# Patient Record
Sex: Female | Born: 1945 | Race: Black or African American | Hispanic: No | Marital: Married | Smoking: Never smoker
Health system: Southern US, Community
[De-identification: ages and names within clinical notes are randomized; demographics above are authoritative.]

## PROBLEM LIST (undated history)

## (undated) DIAGNOSIS — K219 Gastro-esophageal reflux disease without esophagitis: Secondary | ICD-10-CM

## (undated) DIAGNOSIS — Z800 Family history of malignant neoplasm of digestive organs: Secondary | ICD-10-CM

## (undated) DIAGNOSIS — E785 Hyperlipidemia, unspecified: Secondary | ICD-10-CM

## (undated) DIAGNOSIS — H93A9 Pulsatile tinnitus, unspecified ear: Secondary | ICD-10-CM

## (undated) DIAGNOSIS — I10 Essential (primary) hypertension: Secondary | ICD-10-CM

## (undated) DIAGNOSIS — Z803 Family history of malignant neoplasm of breast: Secondary | ICD-10-CM

## (undated) DIAGNOSIS — M1990 Unspecified osteoarthritis, unspecified site: Secondary | ICD-10-CM

## (undated) DIAGNOSIS — R0789 Other chest pain: Secondary | ICD-10-CM

## (undated) DIAGNOSIS — D704 Cyclic neutropenia: Secondary | ICD-10-CM

## (undated) DIAGNOSIS — C50919 Malignant neoplasm of unspecified site of unspecified female breast: Principal | ICD-10-CM

## (undated) DIAGNOSIS — E7800 Pure hypercholesterolemia, unspecified: Secondary | ICD-10-CM

## (undated) HISTORY — DX: Pulsatile tinnitus, unspecified ear: H93.A9

## (undated) HISTORY — DX: Hyperlipidemia, unspecified: E78.5

## (undated) HISTORY — DX: Cyclic neutropenia: D70.4

## (undated) HISTORY — PX: BREAST EXCISIONAL BIOPSY: SUR124

## (undated) HISTORY — DX: Family history of malignant neoplasm of breast: Z80.3

## (undated) HISTORY — DX: Other chest pain: R07.89

## (undated) HISTORY — DX: Family history of malignant neoplasm of digestive organs: Z80.0

## (undated) HISTORY — PX: CATARACT EXTRACTION, BILATERAL: SHX1313

## (undated) HISTORY — DX: Malignant neoplasm of unspecified site of unspecified female breast: C50.919

---

## 1994-05-01 HISTORY — PX: BREAST RECONSTRUCTION: SHX9

## 1994-05-01 HISTORY — PX: MASTECTOMY: SHX3

## 2003-11-05 ENCOUNTER — Encounter: Admission: RE | Admit: 2003-11-05 | Discharge: 2003-11-05 | Payer: Self-pay | Admitting: Internal Medicine

## 2003-12-24 ENCOUNTER — Other Ambulatory Visit: Admission: RE | Admit: 2003-12-24 | Discharge: 2003-12-24 | Payer: Self-pay | Admitting: Obstetrics and Gynecology

## 2004-05-10 ENCOUNTER — Ambulatory Visit: Payer: Self-pay | Admitting: Oncology

## 2004-07-25 ENCOUNTER — Encounter: Admission: RE | Admit: 2004-07-25 | Discharge: 2004-07-25 | Payer: Self-pay | Admitting: Internal Medicine

## 2004-10-05 ENCOUNTER — Ambulatory Visit (HOSPITAL_COMMUNITY): Admission: RE | Admit: 2004-10-05 | Discharge: 2004-10-05 | Payer: Self-pay | Admitting: Gastroenterology

## 2004-12-27 ENCOUNTER — Other Ambulatory Visit: Admission: RE | Admit: 2004-12-27 | Discharge: 2004-12-27 | Payer: Self-pay | Admitting: Obstetrics and Gynecology

## 2005-01-18 ENCOUNTER — Encounter: Admission: RE | Admit: 2005-01-18 | Discharge: 2005-01-18 | Payer: Self-pay | Admitting: Obstetrics and Gynecology

## 2005-05-24 ENCOUNTER — Ambulatory Visit: Payer: Self-pay | Admitting: Oncology

## 2006-01-19 ENCOUNTER — Encounter: Admission: RE | Admit: 2006-01-19 | Discharge: 2006-01-19 | Payer: Self-pay | Admitting: Obstetrics and Gynecology

## 2006-03-13 ENCOUNTER — Other Ambulatory Visit: Admission: RE | Admit: 2006-03-13 | Discharge: 2006-03-13 | Payer: Self-pay | Admitting: Obstetrics and Gynecology

## 2006-05-23 ENCOUNTER — Ambulatory Visit: Payer: Self-pay | Admitting: Oncology

## 2006-05-24 LAB — CBC WITH DIFFERENTIAL (CANCER CENTER ONLY)
BASO#: 0 10*3/uL (ref 0.0–0.2)
BASO%: 0.5 % (ref 0.0–2.0)
EOS%: 2.6 % (ref 0.0–7.0)
HCT: 41.3 % (ref 34.8–46.6)
HGB: 14 g/dL (ref 11.6–15.9)
LYMPH#: 1.1 10*3/uL (ref 0.9–3.3)
MCH: 30.3 pg (ref 26.0–34.0)
MCHC: 33.9 g/dL (ref 32.0–36.0)
MONO%: 8 % (ref 0.0–13.0)
NEUT#: 1.6 10*3/uL (ref 1.5–6.5)
NEUT%: 52.3 % (ref 39.6–80.0)
RDW: 12.8 % (ref 10.5–14.6)

## 2006-05-24 LAB — COMPREHENSIVE METABOLIC PANEL
AST: 21 U/L (ref 0–37)
Albumin: 4.6 g/dL (ref 3.5–5.2)
Alkaline Phosphatase: 60 U/L (ref 39–117)
BUN: 14 mg/dL (ref 6–23)
Potassium: 4.2 mEq/L (ref 3.5–5.3)
Total Bilirubin: 0.4 mg/dL (ref 0.3–1.2)

## 2006-05-24 LAB — LACTATE DEHYDROGENASE: LDH: 193 U/L (ref 94–250)

## 2007-01-24 ENCOUNTER — Encounter: Admission: RE | Admit: 2007-01-24 | Discharge: 2007-01-24 | Payer: Self-pay | Admitting: Obstetrics and Gynecology

## 2007-03-20 ENCOUNTER — Other Ambulatory Visit: Admission: RE | Admit: 2007-03-20 | Discharge: 2007-03-20 | Payer: Self-pay | Admitting: Obstetrics and Gynecology

## 2007-09-04 ENCOUNTER — Ambulatory Visit: Payer: Self-pay | Admitting: Oncology

## 2007-09-05 LAB — COMPREHENSIVE METABOLIC PANEL
ALT: 15 U/L (ref 0–35)
Albumin: 4.6 g/dL (ref 3.5–5.2)
Alkaline Phosphatase: 50 U/L (ref 39–117)
CO2: 26 mEq/L (ref 19–32)
Glucose, Bld: 94 mg/dL (ref 70–99)
Potassium: 4.5 mEq/L (ref 3.5–5.3)
Sodium: 140 mEq/L (ref 135–145)
Total Protein: 7.5 g/dL (ref 6.0–8.3)

## 2007-09-05 LAB — CBC WITH DIFFERENTIAL (CANCER CENTER ONLY)
BASO#: 0 10*3/uL (ref 0.0–0.2)
BASO%: 0.3 % (ref 0.0–2.0)
EOS%: 2.3 % (ref 0.0–7.0)
HCT: 38.3 % (ref 34.8–46.6)
HGB: 13.1 g/dL (ref 11.6–15.9)
LYMPH#: 1.4 10*3/uL (ref 0.9–3.3)
LYMPH%: 48.5 % — ABNORMAL HIGH (ref 14.0–48.0)
MCHC: 34.1 g/dL (ref 32.0–36.0)
MCV: 86 fL (ref 81–101)
NEUT%: 41.5 % (ref 39.6–80.0)
RDW: 12.6 % (ref 10.5–14.6)

## 2008-02-12 ENCOUNTER — Encounter: Admission: RE | Admit: 2008-02-12 | Discharge: 2008-02-12 | Payer: Self-pay | Admitting: Obstetrics and Gynecology

## 2008-04-02 ENCOUNTER — Other Ambulatory Visit: Admission: RE | Admit: 2008-04-02 | Discharge: 2008-04-02 | Payer: Self-pay | Admitting: Obstetrics and Gynecology

## 2008-05-01 HISTORY — PX: BREAST RECONSTRUCTION: SHX9

## 2008-09-16 ENCOUNTER — Ambulatory Visit: Payer: Self-pay | Admitting: Oncology

## 2008-09-17 LAB — CBC WITH DIFFERENTIAL (CANCER CENTER ONLY)
BASO#: 0 10*3/uL (ref 0.0–0.2)
Eosinophils Absolute: 0.1 10*3/uL (ref 0.0–0.5)
HCT: 38.6 % (ref 34.8–46.6)
HGB: 13.4 g/dL (ref 11.6–15.9)
LYMPH#: 1.6 10*3/uL (ref 0.9–3.3)
LYMPH%: 48.4 % — ABNORMAL HIGH (ref 14.0–48.0)
MCV: 86 fL (ref 81–101)
MONO#: 0.2 10*3/uL (ref 0.1–0.9)
NEUT%: 41.5 % (ref 39.6–80.0)
RBC: 4.5 10*6/uL (ref 3.70–5.32)
RDW: 13.5 % (ref 10.5–14.6)
WBC: 3.3 10*3/uL — ABNORMAL LOW (ref 3.9–10.0)

## 2008-09-17 LAB — CMP (CANCER CENTER ONLY)
Albumin: 3.8 g/dL (ref 3.3–5.5)
Alkaline Phosphatase: 67 U/L (ref 26–84)
BUN, Bld: 21 mg/dL (ref 7–22)
Glucose, Bld: 97 mg/dL (ref 73–118)
Total Bilirubin: 0.4 mg/dl (ref 0.20–1.60)

## 2008-09-17 LAB — CANCER ANTIGEN 27.29: CA 27.29: 16 U/mL (ref 0–39)

## 2009-02-25 ENCOUNTER — Encounter: Admission: RE | Admit: 2009-02-25 | Discharge: 2009-02-25 | Payer: Self-pay | Admitting: Obstetrics and Gynecology

## 2009-04-13 ENCOUNTER — Other Ambulatory Visit: Admission: RE | Admit: 2009-04-13 | Discharge: 2009-04-13 | Payer: Self-pay | Admitting: Obstetrics and Gynecology

## 2010-03-02 ENCOUNTER — Encounter: Admission: RE | Admit: 2010-03-02 | Discharge: 2010-03-02 | Payer: Self-pay | Admitting: Obstetrics and Gynecology

## 2010-04-04 ENCOUNTER — Other Ambulatory Visit
Admission: RE | Admit: 2010-04-04 | Discharge: 2010-04-04 | Payer: Self-pay | Source: Home / Self Care | Admitting: Obstetrics and Gynecology

## 2010-09-16 NOTE — Op Note (Signed)
Shelley Gutierrez, Shelley Gutierrez                 ACCOUNT NO.:  [id number]   MEDICAL RECORD NO.:  [id number]          PATIENT TYPE:  AMB   LOCATION:  ENDO                         FACILITY:  [facility]   PHYSICIAN:  Danise Edge, M.D.   DATE OF BIRTH:  11-16-1945   DATE OF PROCEDURE:  10/05/2004  DATE OF DISCHARGE:                                 OPERATIVE REPORT   PROCEDURE:  Colonoscopy.   PROCEDURE INDICATIONS:  Shelley Gutierrez is a 65 year old female born Aug 11, 1945. Shelley Gutierrez underwent a CT scan of the abdomen and pelvis to  evaluate lower abdominal pain. The CT scan reveals only a uterine fibroid.   ENDOSCOPIST:  Danise Edge, M.D.   PREMEDICATION:  Versed 7 mg, Demerol 70 mg.   PROCEDURE:  After obtaining informed consent, Shelley Gutierrez was placed in the  left lateral decubitus position. I administered intravenous Demerol and  intravenous Versed to achieve conscious sedation for the procedure. The  patient's blood pressure, oxygen saturation and cardiac rhythm were  monitored throughout the procedure and documented in the medical record.   Anal inspection and digital rectal exam were normal. The Olympus adjustable  pediatric colonoscope was introduced into the rectum and advanced to the  cecum. A normal-appearing ileocecal valve was easily intubated and the  distal ileum inspected. Colonic preparation with exam today was  satisfactory.   Rectum normal. Retroflexed view of the distal rectum normal.  Sigmoid colon and descending colon normal.  Splenic flexure normal.  Transverse colon normal.  Hepatic flexure normal.  Ascending colon normal.  Cecum and ileocecal valve normal.  Distal ileum normal.   ASSESSMENT:  Normal screening proctocolonoscopy to the cecum with inspection  of the distal ileum.      MJ/MEDQ  D:  10/05/2004  T:  10/05/2004  Job:  [id number]   cc:   Georgann Housekeeper, MD  [address]  [place]  [place] [address]  Fax: [phone]   Artist Pais, M.D.  [address], [address]  [place]  [place] [address]  Fax: [phone]

## 2010-12-22 ENCOUNTER — Encounter: Payer: Self-pay | Admitting: Oncology

## 2011-02-16 ENCOUNTER — Other Ambulatory Visit: Payer: Self-pay | Admitting: Obstetrics and Gynecology

## 2011-02-16 DIAGNOSIS — Z1231 Encounter for screening mammogram for malignant neoplasm of breast: Secondary | ICD-10-CM

## 2011-02-21 DIAGNOSIS — R0789 Other chest pain: Secondary | ICD-10-CM

## 2011-02-21 HISTORY — DX: Other chest pain: R07.89

## 2011-03-03 ENCOUNTER — Encounter: Payer: Self-pay | Admitting: Oncology

## 2011-03-03 DIAGNOSIS — D704 Cyclic neutropenia: Secondary | ICD-10-CM | POA: Insufficient documentation

## 2011-03-03 DIAGNOSIS — C50919 Malignant neoplasm of unspecified site of unspecified female breast: Secondary | ICD-10-CM

## 2011-03-03 DIAGNOSIS — C50411 Malignant neoplasm of upper-outer quadrant of right female breast: Secondary | ICD-10-CM | POA: Insufficient documentation

## 2011-03-03 HISTORY — DX: Cyclic neutropenia: D70.4

## 2011-03-03 HISTORY — DX: Malignant neoplasm of unspecified site of unspecified female breast: C50.919

## 2011-03-07 ENCOUNTER — Other Ambulatory Visit: Payer: Federal, State, Local not specified - PPO | Admitting: Lab

## 2011-03-07 ENCOUNTER — Telehealth: Payer: Self-pay | Admitting: *Deleted

## 2011-03-07 ENCOUNTER — Ambulatory Visit (HOSPITAL_BASED_OUTPATIENT_CLINIC_OR_DEPARTMENT_OTHER): Payer: Federal, State, Local not specified - PPO | Admitting: Oncology

## 2011-03-07 DIAGNOSIS — Z853 Personal history of malignant neoplasm of breast: Secondary | ICD-10-CM

## 2011-03-07 DIAGNOSIS — D704 Cyclic neutropenia: Secondary | ICD-10-CM

## 2011-03-07 DIAGNOSIS — C50919 Malignant neoplasm of unspecified site of unspecified female breast: Secondary | ICD-10-CM

## 2011-03-07 LAB — CBC WITH DIFFERENTIAL/PLATELET
BASO%: 0.6 % (ref 0.0–2.0)
EOS%: 1.6 % (ref 0.0–7.0)
MCH: 30.7 pg (ref 25.1–34.0)
MCV: 89.9 fL (ref 79.5–101.0)
MONO%: 8.8 % (ref 0.0–14.0)
RBC: 4.6 10*6/uL (ref 3.70–5.45)
RDW: 13.2 % (ref 11.2–14.5)
lymph#: 1.3 10*3/uL (ref 0.9–3.3)

## 2011-03-07 LAB — COMPREHENSIVE METABOLIC PANEL
ALT: 21 U/L (ref 0–35)
AST: 18 U/L (ref 0–37)
Albumin: 4.7 g/dL (ref 3.5–5.2)
Alkaline Phosphatase: 57 U/L (ref 39–117)
Calcium: 10.5 mg/dL (ref 8.4–10.5)
Chloride: 105 mEq/L (ref 96–112)
Potassium: 4.2 mEq/L (ref 3.5–5.3)
Sodium: 142 mEq/L (ref 135–145)
Total Protein: 7.5 g/dL (ref 6.0–8.3)

## 2011-03-07 NOTE — Telephone Encounter (Signed)
Gave patient appointment for nancy rudolph and dr.khan in 2013

## 2011-03-07 NOTE — Progress Notes (Signed)
Hematology and Oncology Follow Up Visit  Shelley Gutierrez [id number] Feb 08, 1946 65 y.o. 03/07/2011 11:04 AM   DIAGNOSIS:   Encounter Diagnoses  Name Primary?  . Breast cancer   . Cyclical neutropenia      PAST THERAPY: #1 diagnosis of breast cancer 1996. She underwent a mastectomy of the right breast with axillary lymph node dissection. The tumor was node negative. She had immediate TRAM reconstruction.  #2 cyclical neutropenia leukopenia. Currently on observation only.  Current therapy:  #1 breast cancer observation  #2: Cyclical neutropenia: Observation.   Interim History:  Patient is seen in followup today. She was last seen by me in May 2010. She has had no real complications. She was recently seen by Dr. Dewain Penning. A CBC was performed that showed a lowering of her white count to 2.5. Her ANC was about 1.2. The patient however has not had any problems with infections. She is denying any fevers chills night sweats headaches shortness of breath chest pains no recurrent pneumonia is no upper respiratory tract infections. She has had her flu shot apparently. She has no nausea no comedo pain no skin infections. She is up-to-date on her mammograms. She is about due for another one in the next few months. Her major the 10 point review of systems is negative.  Medications: I have reviewed the patient's current medications.  Allergies: No Known Allergies  Past Medical History, Surgical history, Social history, and Family History were reviewed and updated.  Review of Systems: Constitutional:  Negative for fever, chills, night sweats, anorexia, weight loss, pain. Cardiovascular: no chest pain or dyspnea on exertion Respiratory: no cough, shortness of breath, or wheezing Neurological: no TIA or stroke symptoms Dermatological: negative ENT: negative Skin Gastrointestinal: no abdominal pain, change in bowel habits, or black or bloody stools Genito-Urinary: no dysuria, trouble voiding,  or hematuria Hematological and Lymphatic: No cervical supraclavicular or  axillary lymphadenopathy. Breast: Patient has a reconstructed right breast the surgical scar looks well healed. Left breast no masses nipple discharge no skin changes no nipple retraction. Musculoskeletal: negative Remaining ROS negative.  Physical Exam:  Blood pressure 149/90, pulse 73, temperature 98.1 F (36.7 C), temperature source Oral, height 5' 5.3" (1.659 m), weight 153 lb 3.2 oz (69.491 kg). General appearance: alert, cooperative and appears stated age  HEENT exam: EOMI PERRLA sclerae anicteric no conjunctival pallor oral mucosa is moist neck is supple no palpable cervical supraclavicular or axillary adenopathy  Lungs: Clear bilaterally to auscultation and percussion  Cardiovascular: Regular rate rhythm no murmurs gallops or rubs  Abdomen soft nontender nondistended bowel sounds are present no hepatosplenomegaly  extremities no clubbing edema or cyanosis  Neuro: Patient is alert oriented x3 DTRs are +4 motor and sensory is intact strength is symmetrical in upper and lower extremities.   Skin: No rashes.  Breast: Right breast is reconstructed there is no masses no skin changes. Left breast no masses nipple discharge no skin changes retraction no masses.   Lab Results: Lab Results  Component Value Date   WBC 3.3* 09/17/2008   HGB 14.1 03/07/2011   HCT 41.3 03/07/2011   MCV 89.9 03/07/2011   PLT 204 03/07/2011     Chemistry      Component Value Date/Time   NA 140 09/17/2008 1058   NA 140 09/05/2007 1004   K 4.3 09/17/2008 1058   K 4.5 09/05/2007 1004   CL 105 09/17/2008 1058   CL 105 09/05/2007 1004   CO2 28 09/17/2008 1058  CO2 26 09/05/2007 1004   BUN 21 09/17/2008 1058   BUN 21 09/05/2007 1004   CREATININE 1.2 09/17/2008 1058   CREATININE 1.05 09/05/2007 1004      Component Value Date/Time   CALCIUM 9.9 09/17/2008 1058   CALCIUM 10.8* 09/05/2007 1004   ALKPHOS 67 09/17/2008 1058   ALKPHOS 50 09/05/2007  1004   AST 27 09/17/2008 1058   AST 13 09/05/2007 1004   ALT 15 09/05/2007 1004   BILITOT 0.40 09/17/2008 1058   BILITOT 0.3 09/05/2007 1004       Radiological Studies:  No results found.   IMPRESSIONS AND PLAN: A 65 y.o. female with   #1 patient with history of breast cancer 1996 status post mastectomy with reconstruction. She has no clinical evidence of disease. She will proceed with getting her mammograms as scheduled on a yearly basis of the left breast.  #2 leukopenia patient overall is doing well her white count today is 3.0 with an ANC of 1.4. She has not had any problems with infections. In fact when she does have upper respiratory tract infection see you she recovers from it very quickly. I therefore do not think that she warrants any intervention such as Neulasta or Neupogen. I have reassured her.  #3 my recommendation is to get a CBC done on her every 6 months. If her white count goes down further or if she starts to have recurrent infections that do not resolve quickly then she certainly will need intervention such as Neupogen or Neulasta a boot to boost her white count up.  #4 I have also recommended that I see her on a yearly basis. Since she is a long-term breast survivor I do think that she would benefit from her breast survivor clinic. I have therefore recommended that she be seen by Colman Cater in one years time. At that time we will get a CBC as well to monitor her white count.  #5 she knows to call me with any problems questions or concerns. I spent about 30 minutes with the patient greater than 50% of the time was spent in counseling and coordination of care    Drue Second, MD Medical/Oncology [facility] [phone] (beeper) [phone] (Office)  03/07/2011, 11:04 AM 11/6/201211:04 AM

## 2011-03-14 ENCOUNTER — Ambulatory Visit
Admission: RE | Admit: 2011-03-14 | Discharge: 2011-03-14 | Disposition: A | Discharge status: Home / Self Care | Payer: Federal, State, Local not specified - PPO | Source: Ambulatory Visit | Attending: Obstetrics and Gynecology | Admitting: Obstetrics and Gynecology

## 2011-03-14 DIAGNOSIS — Z1231 Encounter for screening mammogram for malignant neoplasm of breast: Secondary | ICD-10-CM

## 2011-04-13 ENCOUNTER — Telehealth: Payer: Self-pay | Admitting: Family

## 2011-04-13 NOTE — Telephone Encounter (Signed)
Per NR called pt and informed her that her appt on 01/10 was moved to 12:30pm that same day

## 2011-04-19 ENCOUNTER — Other Ambulatory Visit: Payer: Self-pay | Admitting: Obstetrics and Gynecology

## 2011-04-19 ENCOUNTER — Other Ambulatory Visit (HOSPITAL_COMMUNITY)
Admission: RE | Admit: 2011-04-19 | Discharge: 2011-04-19 | Disposition: A | Discharge status: Home / Self Care | Payer: Federal, State, Local not specified - PPO | Source: Ambulatory Visit | Attending: Obstetrics and Gynecology | Admitting: Obstetrics and Gynecology

## 2011-04-19 DIAGNOSIS — Z01419 Encounter for gynecological examination (general) (routine) without abnormal findings: Secondary | ICD-10-CM | POA: Insufficient documentation

## 2011-05-11 ENCOUNTER — Ambulatory Visit: Payer: Federal, State, Local not specified - PPO | Admitting: Family

## 2011-05-11 ENCOUNTER — Telehealth: Payer: Self-pay | Admitting: Oncology

## 2011-05-11 ENCOUNTER — Other Ambulatory Visit (HOSPITAL_BASED_OUTPATIENT_CLINIC_OR_DEPARTMENT_OTHER): Payer: Federal, State, Local not specified - PPO | Admitting: Lab

## 2011-05-11 ENCOUNTER — Other Ambulatory Visit: Payer: Federal, State, Local not specified - PPO | Admitting: Lab

## 2011-05-11 ENCOUNTER — Encounter: Payer: Federal, State, Local not specified - PPO | Admitting: Family

## 2011-05-11 ENCOUNTER — Ambulatory Visit: Payer: Federal, State, Local not specified - PPO

## 2011-05-11 VITALS — BP 122/77 | HR 81 | Temp 97.7°F | Ht 65.3 in | Wt 154.4 lb

## 2011-05-11 DIAGNOSIS — C50919 Malignant neoplasm of unspecified site of unspecified female breast: Secondary | ICD-10-CM

## 2011-05-11 DIAGNOSIS — D704 Cyclic neutropenia: Secondary | ICD-10-CM

## 2011-05-11 LAB — COMPREHENSIVE METABOLIC PANEL
AST: 20 U/L (ref 0–37)
Alkaline Phosphatase: 54 U/L (ref 39–117)
BUN: 9 mg/dL (ref 6–23)
Calcium: 9.9 mg/dL (ref 8.4–10.5)
Chloride: 104 mEq/L (ref 96–112)
Creatinine, Ser: 0.85 mg/dL (ref 0.50–1.10)
Total Bilirubin: 0.5 mg/dL (ref 0.3–1.2)

## 2011-05-11 LAB — CBC WITH DIFFERENTIAL/PLATELET
Basophils Absolute: 0 10*3/uL (ref 0.0–0.1)
EOS%: 1.8 % (ref 0.0–7.0)
HCT: 41.6 % (ref 34.8–46.6)
HGB: 13.9 g/dL (ref 11.6–15.9)
LYMPH%: 39.4 % (ref 14.0–49.7)
MCH: 29.6 pg (ref 25.1–34.0)
MONO#: 0.3 10*3/uL (ref 0.1–0.9)
NEUT%: 49.1 % (ref 38.4–76.8)
Platelets: 235 10*3/uL (ref 145–400)
lymph#: 1.5 10*3/uL (ref 0.9–3.3)

## 2011-05-11 NOTE — Telephone Encounter (Signed)
Gv pt appt for nov2013 °

## 2011-05-12 NOTE — Progress Notes (Signed)
Appt was scheduled for wrong day. She will be seen yearly, will return Nov 3013.

## 2012-01-17 ENCOUNTER — Emergency Department (HOSPITAL_COMMUNITY): Payer: Medicare Other

## 2012-01-17 ENCOUNTER — Emergency Department (HOSPITAL_COMMUNITY)
Admission: EM | Admit: 2012-01-17 | Discharge: 2012-01-18 | Disposition: A | Discharge status: Home / Self Care | Payer: Medicare Other | Attending: Emergency Medicine | Admitting: Emergency Medicine

## 2012-01-17 ENCOUNTER — Encounter (HOSPITAL_COMMUNITY): Payer: Self-pay | Admitting: *Deleted

## 2012-01-17 DIAGNOSIS — R42 Dizziness and giddiness: Secondary | ICD-10-CM

## 2012-01-17 DIAGNOSIS — Z880 Allergy status to penicillin: Secondary | ICD-10-CM | POA: Insufficient documentation

## 2012-01-17 DIAGNOSIS — R112 Nausea with vomiting, unspecified: Secondary | ICD-10-CM | POA: Insufficient documentation

## 2012-01-17 DIAGNOSIS — Z853 Personal history of malignant neoplasm of breast: Secondary | ICD-10-CM | POA: Insufficient documentation

## 2012-01-17 HISTORY — DX: Pure hypercholesterolemia, unspecified: E78.00

## 2012-01-17 HISTORY — DX: Essential (primary) hypertension: I10

## 2012-01-17 HISTORY — DX: Unspecified osteoarthritis, unspecified site: M19.90

## 2012-01-17 LAB — BASIC METABOLIC PANEL
BUN: 12 mg/dL (ref 6–23)
Calcium: 10.7 mg/dL — ABNORMAL HIGH (ref 8.4–10.5)
Creatinine, Ser: 0.79 mg/dL (ref 0.50–1.10)
GFR calc Af Amer: >90 mL/min (ref >90)
GFR calc non Af Amer: 86 mL/min — ABNORMAL LOW (ref >90)
Glucose, Bld: 106 mg/dL — ABNORMAL HIGH (ref 70–99)
Potassium: 3.7 mEq/L (ref 3.5–5.1)

## 2012-01-17 LAB — CBC WITH DIFFERENTIAL/PLATELET
Basophils Relative: 0 % (ref 0–1)
Eosinophils Absolute: 0 10*3/uL (ref 0.0–0.7)
Eosinophils Relative: 0 % (ref 0–5)
Hemoglobin: 15.3 g/dL — ABNORMAL HIGH (ref 12.0–15.0)
Lymphs Abs: 0.9 10*3/uL (ref 0.7–4.0)
MCH: 30.2 pg (ref 26.0–34.0)
MCHC: 34 g/dL (ref 30.0–36.0)
MCV: 88.8 fL (ref 78.0–100.0)
Monocytes Absolute: 0.3 10*3/uL (ref 0.1–1.0)
Monocytes Relative: 3 % (ref 3–12)
Neutrophils Relative %: 87 % — ABNORMAL HIGH (ref 43–77)
RBC: 5.07 MIL/uL (ref 3.87–5.11)

## 2012-01-17 MED ORDER — MECLIZINE HCL 50 MG PO TABS: 50.0000 mg | ORAL_TABLET | Freq: Three times a day (TID) | ORAL | Status: DC | PRN

## 2012-01-17 MED ORDER — ONDANSETRON 4 MG PO TBDP
4.0000 mg | ORAL_TABLET | Freq: Once | ORAL | Status: AC
  Administered 2012-01-17: 4 mg via ORAL
  Filled 2012-01-17: qty 1

## 2012-01-17 MED ORDER — MECLIZINE HCL 25 MG PO TABS
25.0000 mg | ORAL_TABLET | Freq: Once | ORAL | Status: AC
  Administered 2012-01-17: 25 mg via ORAL
  Filled 2012-01-17: qty 1

## 2012-01-17 MED ORDER — ONDANSETRON HCL 4 MG PO TABS: 4.0000 mg | ORAL_TABLET | Freq: Four times a day (QID) | ORAL | Status: DC

## 2012-01-17 MED ORDER — ONDANSETRON HCL 4 MG/2ML IJ SOLN
4.0000 mg | Freq: Once | INTRAMUSCULAR | Status: DC
  Filled 2012-01-17 (×2): qty 2

## 2012-01-17 MED ORDER — SODIUM CHLORIDE 0.9 % IV BOLUS (SEPSIS): 1000.0000 mL | Freq: Once | INTRAVENOUS | Status: DC

## 2012-01-17 MED ORDER — BACITRACIN ZINC 500 UNIT/GM EX OINT
TOPICAL_OINTMENT | CUTANEOUS | Status: AC
  Filled 2012-01-17: qty 0.9

## 2012-01-17 NOTE — ED Provider Notes (Signed)
History     CSN: [id number]  Arrival date & time 01/17/12  1027   First MD Initiated Contact with Patient 01/17/12 1040      Chief Complaint  Patient presents with  . Dizziness    (Consider location/radiation/quality/duration/timing/severity/associated sxs/prior treatment) HPI Comments: 66 y/o female presents with dizziness x 3 days. On Sunday she was driving back to [place] from [place] and began to feel dizzy, so she stopped at a hotel in [place] for the night. When she got out of the car, she became dizzy and lightheaded. Symptoms resolved once she was settled into her room and sitting down. The next morning, she became nauseated. She ate a banana and oatmeal and got back into the car and finished her drive. When she got home, she began vomiting. Nausea and vomiting worsened throughout the day into yesterday. She becomes dizzy with any head movements or going from a seated to standing position and states it feels as if she is on a ship. Admits to occasional fullness feeling in her right ear. Vision becomes blurred with dizziness. Never experienced vertigo in the past. Admits to feeling weak and tired. Denies any fever, chills, abdominal pain, diarrhea.  The history is provided by the patient.    Past Medical History  Diagnosis Date  . Breast cancer 03/03/2011  . Cyclical neutropenia 03/03/2011  . Hypertension   . Arthritis   . Hypercholesteremia     Past Surgical History  Procedure Date  . Mastectomy     Right Side    No family history on file.  History  Substance Use Topics  . Smoking status: Not on file  . Smokeless tobacco: Not on file  . Alcohol Use:     OB History    Grav Para Term Preterm Abortions TAB SAB Ect Mult Living                  Review of Systems  Constitutional: Positive for appetite change and fatigue. Negative for fever and diaphoresis.  HENT: Negative for hearing loss, ear pain, congestion and neck pain.        Right ear "fullness"  Eyes:  Positive for visual disturbance (with dizziness).  Respiratory: Negative for shortness of breath.   Cardiovascular: Negative for chest pain, palpitations and leg swelling.  Gastrointestinal: Positive for nausea and vomiting. Negative for abdominal pain and diarrhea.  Genitourinary: Negative for dysuria, urgency and frequency.  Musculoskeletal: Negative for arthralgias.  Skin: Negative for color change, pallor and rash.  Neurological: Positive for dizziness, weakness and light-headedness. Negative for syncope.  Psychiatric/Behavioral: Negative for confusion.    Allergies  Penicillins  Home Medications  No current outpatient prescriptions on file.  BP 137/71  Pulse 66  Temp 98.2 F (36.8 C) (Oral)  Resp 22  SpO2 100%  Physical Exam  Constitutional: She is oriented to person, place, and time. Vital signs are normal. She appears well-developed and well-nourished. No distress.       Appears fatigued. Very slow movements to avoid reciprocating dizziness  HENT:  Head: Normocephalic and atraumatic.  Right Ear: Hearing, tympanic membrane and external ear normal. No drainage. No decreased hearing is noted.  Left Ear: Hearing, tympanic membrane and external ear normal. No drainage. No decreased hearing is noted.  Nose: Nose normal.  Mouth/Throat: Uvula is midline, oropharynx is clear and moist and mucous membranes are normal.       Mild cerumen in bilateral canals  Eyes: Conjunctivae normal and EOM are normal. Pupils are  equal, round, and reactive to light.  Neck: Normal range of motion. Neck supple.  Cardiovascular: Normal rate, regular rhythm and normal heart sounds.   No murmur heard. Pulses:      Radial pulses are 2+ on the right side, and 2+ on the left side.       Dorsalis pedis pulses are 1+ on the right side, and 1+ on the left side.       Posterior tibial pulses are 1+ on the right side, and 1+ on the left side.  Pulmonary/Chest: Effort normal and breath sounds normal. She has  no decreased breath sounds. She has no rales.  Abdominal: Soft. Normal appearance and bowel sounds are normal.       Palpation reciprocates nausea  Musculoskeletal: Normal range of motion.  Neurological: She is alert and oriented to person, place, and time.  Skin: Skin is warm, dry and intact. She is not diaphoretic. No pallor.  Psychiatric: She has a normal mood and affect. Her speech is normal and behavior is normal.    ED Course  Procedures (including critical care time)   Labs Reviewed  CBC WITH DIFFERENTIAL  BASIC METABOLIC PANEL   Results for orders placed during the hospital encounter of 01/17/12  CBC WITH DIFFERENTIAL      Component Value Range   WBC 9.0  4.0 - 10.5 K/uL   RBC 5.07  3.87 - 5.11 MIL/uL   Hemoglobin 15.3 (*) 12.0 - 15.0 g/dL   HCT 11.9  14.7 - 82.9 %   MCV 88.8  78.0 - 100.0 fL   MCH 30.2  26.0 - 34.0 pg   MCHC 34.0  30.0 - 36.0 g/dL   RDW 56.2  13.0 - 86.5 %   Platelets 244  150 - 400 K/uL   Neutrophils Relative 87 (*) 43 - 77 %   Neutro Abs 7.8 (*) 1.7 - 7.7 K/uL   Lymphocytes Relative 10 (*) 12 - 46 %   Lymphs Abs 0.9  0.7 - 4.0 K/uL   Monocytes Relative 3  3 - 12 %   Monocytes Absolute 0.3  0.1 - 1.0 K/uL   Eosinophils Relative 0  0 - 5 %   Eosinophils Absolute 0.0  0.0 - 0.7 K/uL   Basophils Relative 0  0 - 1 %   Basophils Absolute 0.0  0.0 - 0.1 K/uL  BASIC METABOLIC PANEL      Component Value Range   Sodium 139  135 - 145 mEq/L   Potassium 3.7  3.5 - 5.1 mEq/L   Chloride 101  96 - 112 mEq/L   CO2 25  19 - 32 mEq/L   Glucose, Bld 106 (*) 70 - 99 mg/dL   BUN 12  6 - 23 mg/dL   Creatinine, Ser 7.84  0.50 - 1.10 mg/dL   Calcium 69.6 (*) 8.4 - 10.5 mg/dL   GFR calc non Af Amer 86 (*) >90 mL/min   GFR calc Af Amer >90  >90 mL/min    Dg Chest 2 View  01/17/2012  *RADIOLOGY REPORT*  Clinical Data: Dizziness.  History of breast cancer.  CHEST - 2 VIEW  Comparison: None  Findings: The cardiac silhouette, mediastinal and hilar contours are  normal.  Mild calcification noted in the thoracic aorta.  The lungs are clear.  No pleural effusion.  The bony thorax is intact.  IMPRESSION: No acute cardiopulmonary findings.   Original Report Authenticated By: P. Loralie Champagne, M.D.      Date: 01/17/2012  Rate: 64  Rhythm: normal sinus rhythm  QRS Axis: normal  Intervals: normal  ST/T Wave abnormalities: normal  Conduction Disutrbances:none  Narrative Interpretation: no stemi  Old EKG Reviewed: none available   1. Vertigo   2. Nausea & vomiting     12:55 PM No IV able to be obtained on patient. She'll receive ODT Zofran.  MDM  66 y/o female with vertigo symptoms. Will obtain labs, EKG, and CXR to rule out other causes of dizziness. History and exam more consistent with peripheral vertigo. After patient receives fluid and rehydrates I will get her up to walk. 1:29 PM Patient reporting only mild improvement with ODT zofran. Still dizzy with head movements causing nausea. Will give meclizine, more zofran and re-assess. 2:10 PM Patient states her nausea has completely subsided with Zofran. Her dizziness is improving. She was able to get up and walk with only mild dizziness, and states the ground no longer feels like she is on the ocean. I will give her PO fluid challenge and crackers, and if she can tolerate that I will discharge her with both meclizine and Zofran. She'll followup with Dr. Collins [place] within the week.      Trevor Mace, PA-C 01/17/12 [phone]

## 2012-01-17 NOTE — Progress Notes (Signed)
66 year old female had onset yesterday of vertigo with nausea. On exam, she has a lateral nystatin was on right lateral gaze, and rotary nystagmus on upward and downward gaze. Upper and downward gaze does reproduce her symptoms. Symptoms are worse with any head movement. This appears to be peripheral vertigo and she will be treated with oral meclizine following a dose of ondansetron to relieve nausea. She would then need to be reassessed. She fails to respond appropriately to meclizine, she will be sent for MRI scan.

## 2012-01-17 NOTE — ED Notes (Signed)
Pt comes in from [facility] and complains of having dizziness for the past 3 days accompanied with some nausea. Pt states she has been feeling like she has some fluid in her inner ear; vertigo symptoms. Any fast movements makes the pt feel like she is off balance.

## 2012-01-17 NOTE — ED Notes (Signed)
I gave the patient 3 packs of crackers and a cup a water

## 2012-01-17 NOTE — ED Provider Notes (Signed)
ECG shows normal sinus rhythm with a rate of 64, no ectopy. Normal axis. Normal P wave. Normal QRS. Normal intervals. Normal ST and T waves. Impression: normal ECG. No prior ECG available for comparison.   Dione Booze, MD 01/18/12 [phone]

## 2012-01-17 NOTE — ED Notes (Signed)
I got the patient undressed and into a gown and placed her belongings into a belonging bag. In the bag is blue sweat pants, shoes, black socks.

## 2012-01-17 NOTE — ED Notes (Signed)
DGU:[id number]<KV> Expected date:01/17/12<BR> Expected time:10:11 AM<BR> Means of arrival:Ambulance<BR> Comments:<BR> 66yoF, dizziness/?vertigo

## 2012-01-18 NOTE — ED Provider Notes (Signed)
have personally performed and participated in all the services and procedures documented herein. I have reviewed the findings with the patient. Please see separate ED Provider note.   Dione Booze, MD 01/18/12 [phone]

## 2012-03-04 ENCOUNTER — Other Ambulatory Visit: Payer: Self-pay | Admitting: Obstetrics and Gynecology

## 2012-03-04 DIAGNOSIS — Z9011 Acquired absence of right breast and nipple: Secondary | ICD-10-CM

## 2012-03-04 DIAGNOSIS — Z1231 Encounter for screening mammogram for malignant neoplasm of breast: Secondary | ICD-10-CM

## 2012-03-07 ENCOUNTER — Other Ambulatory Visit: Payer: Federal, State, Local not specified - PPO | Admitting: Lab

## 2012-03-07 ENCOUNTER — Ambulatory Visit: Payer: Federal, State, Local not specified - PPO | Admitting: Oncology

## 2012-03-07 ENCOUNTER — Ambulatory Visit: Payer: Federal, State, Local not specified - PPO | Admitting: Family

## 2012-03-08 ENCOUNTER — Other Ambulatory Visit: Payer: Self-pay | Admitting: *Deleted

## 2012-03-08 ENCOUNTER — Other Ambulatory Visit (HOSPITAL_BASED_OUTPATIENT_CLINIC_OR_DEPARTMENT_OTHER): Payer: Medicare Other | Admitting: Lab

## 2012-03-08 ENCOUNTER — Telehealth: Payer: Self-pay | Admitting: Oncology

## 2012-03-08 ENCOUNTER — Encounter: Payer: Self-pay | Admitting: Oncology

## 2012-03-08 ENCOUNTER — Ambulatory Visit (HOSPITAL_BASED_OUTPATIENT_CLINIC_OR_DEPARTMENT_OTHER): Payer: Medicare Other | Admitting: Oncology

## 2012-03-08 VITALS — BP 155/83 | HR 77 | Temp 98.3°F | Resp 20 | Ht 65.3 in | Wt 154.6 lb

## 2012-03-08 DIAGNOSIS — C50919 Malignant neoplasm of unspecified site of unspecified female breast: Secondary | ICD-10-CM

## 2012-03-08 DIAGNOSIS — H93A9 Pulsatile tinnitus, unspecified ear: Secondary | ICD-10-CM

## 2012-03-08 DIAGNOSIS — D704 Cyclic neutropenia: Secondary | ICD-10-CM

## 2012-03-08 DIAGNOSIS — D72819 Decreased white blood cell count, unspecified: Secondary | ICD-10-CM

## 2012-03-08 DIAGNOSIS — Z853 Personal history of malignant neoplasm of breast: Secondary | ICD-10-CM

## 2012-03-08 HISTORY — DX: Pulsatile tinnitus, unspecified ear: H93.A9

## 2012-03-08 LAB — CBC WITH DIFFERENTIAL/PLATELET
BASO%: 1 % (ref 0.0–2.0)
Basophils Absolute: 0 10*3/uL (ref 0.0–0.1)
EOS%: 1.8 % (ref 0.0–7.0)
HGB: 13.9 g/dL (ref 11.6–15.9)
MCH: 31.1 pg (ref 25.1–34.0)
MCHC: 34.2 g/dL (ref 31.5–36.0)
MCV: 91 fL (ref 79.5–101.0)
MONO%: 9.9 % (ref 0.0–14.0)
RBC: 4.48 10*6/uL (ref 3.70–5.45)
RDW: 13.4 % (ref 11.2–14.5)
lymph#: 1.4 10*3/uL (ref 0.9–3.3)

## 2012-03-08 LAB — COMPREHENSIVE METABOLIC PANEL (CC13)
ALT: 19 U/L (ref 0–55)
AST: 17 U/L (ref 5–34)
Albumin: 4 g/dL (ref 3.5–5.0)
Alkaline Phosphatase: 66 U/L (ref 40–150)
BUN: 14 mg/dL (ref 7.0–26.0)
Chloride: 107 mEq/L (ref 98–107)
Potassium: 4.3 mEq/L (ref 3.5–5.1)

## 2012-03-08 NOTE — Telephone Encounter (Signed)
gve the pt her nov 2014 appt calendar °

## 2012-03-08 NOTE — Progress Notes (Signed)
Hematology and Oncology Follow Up Visit  Shelley Gutierrez [id number] 08-24-1945 66 y.o. 03/08/2012 11:16 AM   DIAGNOSIS:   Encounter Diagnoses  Name Primary?  . Breast cancer Yes  . Cyclical neutropenia      PAST THERAPY: #1 diagnosis of breast cancer 1996. She underwent a mastectomy of the right breast with axillary lymph node dissection. The tumor was node negative. She had immediate TRAM reconstruction.  #2 cyclical neutropenia leukopenia. Currently on observation only.  Current therapy:  #1 breast cancer observation  #2: Cyclical neutropenia: Observation.   Interim History:  Patient is seen in followup today. Clinically she is doing well. She does tell me that about a month ago she developed vertigo and she was seen in the emergency room she still occasionally develops the symptoms but overall is doing well she denies any fevers chills night sweats headaches no shortness of breath no chest pains no palpitations no recent infections no hospitalizations. Remainder of the 10 point review of systems is negative.  Medications: I have reviewed the patient's current medications.  Allergies:  Allergies  Allergen Reactions  . Penicillins Other (See Comments)    Pt does not tolerate smell    Past Medical History, Surgical history, Social history, and Family History were reviewed and updated.  Review of Systems: Constitutional:  Negative for fever, chills, night sweats, anorexia, weight loss, pain. Cardiovascular: no chest pain or dyspnea on exertion Respiratory: no cough, shortness of breath, or wheezing Neurological: no TIA or stroke symptoms Dermatological: negative ENT: negative Skin Gastrointestinal: no abdominal pain, change in bowel habits, or black or bloody stools Genito-Urinary: no dysuria, trouble voiding, or hematuria Hematological and Lymphatic: No cervical supraclavicular or  axillary lymphadenopathy. Breast: Patient has a reconstructed right breast the surgical  scar looks well healed. Left breast no masses nipple discharge no skin changes no nipple retraction. Musculoskeletal: negative Remaining ROS negative.  Physical Exam:  Blood pressure 155/83, pulse 77, temperature 98.3 F (36.8 C), resp. rate 20, height 5' 5.3" (1.659 m), weight 154 lb 9.6 oz (70.126 kg). General appearance: alert, cooperative and appears stated age  HEENT exam: EOMI PERRLA sclerae anicteric no conjunctival pallor oral mucosa is moist neck is supple no palpable cervical supraclavicular or axillary adenopathy  Lungs: Clear bilaterally to auscultation and percussion  Cardiovascular: Regular rate rhythm no murmurs gallops or rubs  Abdomen soft nontender nondistended bowel sounds are present no hepatosplenomegaly  extremities no clubbing edema or cyanosis  Neuro: Patient is alert oriented x3 DTRs are +4 motor and sensory is intact strength is symmetrical in upper and lower extremities.   Skin: No rashes.  Breast: Right breast is reconstructed there is no masses no skin changes. Left breast no masses nipple discharge no skin changes retraction no masses.   Lab Results: Lab Results  Component Value Date   WBC 3.9 03/08/2012   HGB 13.9 03/08/2012   HCT 40.8 03/08/2012   MCV 91.0 03/08/2012   PLT 170 03/08/2012     Chemistry      Component Value Date/Time   NA 141 03/08/2012 1014   NA 139 01/17/2012 1120   NA 140 09/17/2008 1058   K 4.3 03/08/2012 1014   K 3.7 01/17/2012 1120   K 4.3 09/17/2008 1058   CL 107 03/08/2012 1014   CL 101 01/17/2012 1120   CL 105 09/17/2008 1058   CO2 27 03/08/2012 1014   CO2 25 01/17/2012 1120   CO2 28 09/17/2008 1058   BUN 14.0 03/08/2012 1014  BUN 12 01/17/2012 1120   BUN 21 09/17/2008 1058   CREATININE 0.8 03/08/2012 1014   CREATININE 0.79 01/17/2012 1120   CREATININE 1.2 09/17/2008 1058      Component Value Date/Time   CALCIUM 10.4 03/08/2012 1014   CALCIUM 10.7* 01/17/2012 1120   CALCIUM 9.9 09/17/2008 1058   ALKPHOS 66 03/08/2012 1014    ALKPHOS 54 05/11/2011 1355   ALKPHOS 67 09/17/2008 1058   AST 17 03/08/2012 1014   AST 20 05/11/2011 1355   AST 27 09/17/2008 1058   ALT 19 03/08/2012 1014   ALT 18 05/11/2011 1355   BILITOT 0.37 03/08/2012 1014   BILITOT 0.5 05/11/2011 1355   BILITOT 0.40 09/17/2008 1058       Radiological Studies:  No results found.   IMPRESSIONS AND PLAN: A 66 y.o. female with   #1 patient with history of breast cancer 1996 status post mastectomy with reconstruction. She has no clinical evidence of disease. She will proceed with getting her mammograms as scheduled on a yearly basis of the left breast.  #2 leukopenia stable we will continue to monitor her.  #3 patient will be seen back in one years time.  she knows to call me with any problems questions or concerns. I spent about 30 minutes with the patient greater than 50% of the time was spent in counseling and coordination of care    Drue Second, MD Medical/Oncology [facility] [phone] (beeper) [phone] (Office)  03/08/2012, 11:16 AM 11/8/201311:16 AM

## 2012-03-08 NOTE — Patient Instructions (Addendum)
I will see you back in 1 year 

## 2012-04-09 ENCOUNTER — Ambulatory Visit
Admission: RE | Admit: 2012-04-09 | Discharge: 2012-04-09 | Disposition: A | Discharge status: Home / Self Care | Payer: Federal, State, Local not specified - PPO | Source: Ambulatory Visit | Attending: Obstetrics and Gynecology | Admitting: Obstetrics and Gynecology

## 2012-04-09 DIAGNOSIS — Z1231 Encounter for screening mammogram for malignant neoplasm of breast: Secondary | ICD-10-CM

## 2012-04-09 DIAGNOSIS — Z9011 Acquired absence of right breast and nipple: Secondary | ICD-10-CM

## 2012-05-09 ENCOUNTER — Other Ambulatory Visit (HOSPITAL_COMMUNITY)
Admission: RE | Admit: 2012-05-09 | Discharge: 2012-05-09 | Disposition: A | Discharge status: Home / Self Care | Payer: Medicare Other | Source: Ambulatory Visit | Attending: Obstetrics and Gynecology | Admitting: Obstetrics and Gynecology

## 2012-05-09 ENCOUNTER — Other Ambulatory Visit: Payer: Self-pay | Admitting: Obstetrics and Gynecology

## 2012-05-09 DIAGNOSIS — Z124 Encounter for screening for malignant neoplasm of cervix: Secondary | ICD-10-CM | POA: Insufficient documentation

## 2012-05-09 DIAGNOSIS — Z1151 Encounter for screening for human papillomavirus (HPV): Secondary | ICD-10-CM | POA: Insufficient documentation

## 2012-05-09 DIAGNOSIS — N760 Acute vaginitis: Secondary | ICD-10-CM | POA: Insufficient documentation

## 2012-08-05 ENCOUNTER — Other Ambulatory Visit: Payer: Self-pay | Admitting: Otolaryngology

## 2012-08-05 DIAGNOSIS — R1310 Dysphagia, unspecified: Secondary | ICD-10-CM

## 2012-08-14 ENCOUNTER — Ambulatory Visit
Admission: RE | Admit: 2012-08-14 | Discharge: 2012-08-14 | Disposition: A | Discharge status: Home / Self Care | Payer: Medicare Other | Source: Ambulatory Visit | Attending: Otolaryngology | Admitting: Otolaryngology

## 2012-08-14 DIAGNOSIS — R1310 Dysphagia, unspecified: Secondary | ICD-10-CM

## 2012-08-27 ENCOUNTER — Encounter: Payer: Self-pay | Admitting: Internal Medicine

## 2012-09-06 ENCOUNTER — Encounter: Payer: Self-pay | Admitting: Internal Medicine

## 2012-09-16 ENCOUNTER — Ambulatory Visit: Payer: Medicare Other | Admitting: Internal Medicine

## 2012-09-24 ENCOUNTER — Ambulatory Visit: Payer: Medicare Other | Admitting: Internal Medicine

## 2012-10-01 ENCOUNTER — Encounter: Payer: Self-pay | Admitting: Internal Medicine

## 2012-10-02 ENCOUNTER — Ambulatory Visit (INDEPENDENT_AMBULATORY_CARE_PROVIDER_SITE_OTHER): Payer: Medicare Other | Admitting: Internal Medicine

## 2012-10-02 ENCOUNTER — Encounter: Payer: Self-pay | Admitting: Internal Medicine

## 2012-10-02 VITALS — BP 100/70 | HR 68 | Ht 65.0 in | Wt 150.1 lb

## 2012-10-02 DIAGNOSIS — R1310 Dysphagia, unspecified: Secondary | ICD-10-CM

## 2012-10-02 DIAGNOSIS — K219 Gastro-esophageal reflux disease without esophagitis: Secondary | ICD-10-CM

## 2012-10-02 NOTE — Progress Notes (Signed)
Patient ID: Shelley Gutierrez, female   DOB: 1946-04-20, 67 y.o.   MRN: [id number] HPI: Shelley Gutierrez is a 67 year old female with a past medical history of breast cancer in the 1990s, cyclical neutropenia who is seen in consultation at the request of Dr. Jearld Fenton for evaluation of reflux. The patient is on today. She reports she was seeing Dr. Jearld Fenton for chronic tonsillitis, sensorineural hearing loss, and cryptic tonsillar debris leading to halitosis. In this workup she had a barium swallow which showed reflux disease and she was referred to [place]. Reports she feels well and very rarely has heartburn. She reports her biggest issues have been "bad breath" and a "coating on her tongue". She reports she took antibiotics for approximately one month and things have improved somewhat. She reports her only issues with swallowing occur with large pills such as calcium tablets and occasionally nuts. She reports after her barium swallow she has made lifestyle modifications including paying more attention to chewing and not eating late at night. She feels this has helped greatly. She denies dysphagia or odynophagia with solid foods or liquids (other than pills as previously stated).  She denies nausea or vomiting. No early satiety. No significant weight loss. She reports normal bowel habits without diarrhea, constipation, melena or rectal bleeding. She does report a previous colonoscopy performed by Dr. Laural Benes which was reportedly normal. She reports this was 8 years ago and she is due repeat screening colonoscopy in 2016. She does report she tried Nexium in the past without benefit.  Patient Active Problem List   Diagnosis Date Noted  . Breast cancer 03/03/2011  . Cyclical neutropenia 03/03/2011    Past Surgical History  Procedure Laterality Date  . Mastectomy  1996    Right Side  . Breast reconstruction  1996    Right Side  . Breast reconstruction  2010    Right Side    Current Outpatient Prescriptions  Medication  Sig Dispense Refill  . aspirin EC 81 MG tablet Take 81 mg by mouth daily.      . calcium-vitamin D (OSCAL WITH D) 500-200 MG-UNIT per tablet Take 1 tablet by mouth daily.      Marland Kitchen loratadine (CLARITIN) 10 MG tablet Take 10 mg by mouth as needed.      . meclizine (ANTIVERT) 50 MG tablet Take 1 tablet (50 mg total) by mouth 3 (three) times daily as needed.  30 tablet  0  . Multiple Vitamin (MULTIVITAMIN WITH MINERALS) TABS Take 1 tablet by mouth daily.      . ondansetron (ZOFRAN) 4 MG tablet Take 1 tablet (4 mg total) by mouth every 6 (six) hours.  12 tablet  0  . valsartan (DIOVAN) 40 MG tablet Take 40 mg by mouth daily.      . vitamin C (ASCORBIC ACID) 500 MG tablet Take 500 mg by mouth daily.       No current facility-administered medications for this visit.    Allergies  Allergen Reactions  . Penicillins Other (See Comments)    Pt does not tolerate smell    Family History  Problem Relation Age of Onset  . Cancer Mother     Breast  . Cancer Father     Throat  . Cancer Maternal Grandmother     Pancreas  . Heart Problems Maternal Grandfather     Poor Circulation  . Graves' disease Sister     History  Substance Use Topics  . Smoking status: Never Smoker   . Smokeless  tobacco: Never Used  . Alcohol Use: No    ROS: As per history of present illness, otherwise negative  BP 100/70  Pulse 68  Ht 5\' 5"  (1.651 m)  Wt 150 lb 2 oz (68.096 kg)  BMI 24.98 kg/m2 Constitutional: Well-developed and well-nourished. No distress. HEENT: Normocephalic and atraumatic. Oropharynx is clear and moist. No oropharyngeal exudate. Conjunctivae are normal.  No scleral icterus. Neck: Neck supple. Trachea midline. Cardiovascular: Normal rate, regular rhythm and intact distal pulses. No M/R/G Pulmonary/chest: Effort normal and breath sounds normal. No wheezing, rales or rhonchi. Abdominal: Soft, nontender, nondistended. Bowel sounds active throughout. There are no masses palpable. No  hepatosplenomegaly. Extremities: no clubbing, cyanosis, or edema Lymphadenopathy: No cervical adenopathy noted. Neurological: Alert and oriented to person place and time. Skin: Skin is warm and dry. No rashes noted. Psychiatric: Normal mood and affect. Behavior is normal.  RELEVANT LABS AND IMAGING: CBC    Component Value Date/Time   WBC 3.9 03/08/2012 1014   WBC 9.0 01/17/2012 1120   WBC 3.3* 09/17/2008 1058   RBC 4.48 03/08/2012 1014   RBC 5.07 01/17/2012 1120   HGB 13.9 03/08/2012 1014   HGB 15.3* 01/17/2012 1120   HGB 13.4 09/17/2008 1058   HCT 40.8 03/08/2012 1014   HCT 45.0 01/17/2012 1120   HCT 38.6 09/17/2008 1058   PLT 170 03/08/2012 1014   PLT 244 01/17/2012 1120   PLT 262 09/17/2008 1058   MCV 91.0 03/08/2012 1014   MCV 88.8 01/17/2012 1120   MCV 86 09/17/2008 1058   MCH 31.1 03/08/2012 1014   MCH 30.2 01/17/2012 1120   MCH 29.7 09/17/2008 1058   MCHC 34.2 03/08/2012 1014   MCHC 34.0 01/17/2012 1120   MCHC 34.6 09/17/2008 1058   RDW 13.4 03/08/2012 1014   RDW 12.8 01/17/2012 1120   RDW 13.5 09/17/2008 1058   LYMPHSABS 1.4 03/08/2012 1014   LYMPHSABS 0.9 01/17/2012 1120   LYMPHSABS 1.6 09/17/2008 1058   MONOABS 0.4 03/08/2012 1014   MONOABS 0.3 01/17/2012 1120   EOSABS 0.1 03/08/2012 1014   EOSABS 0.0 01/17/2012 1120   EOSABS 0.1 09/17/2008 1058   BASOSABS 0.0 03/08/2012 1014   BASOSABS 0.0 01/17/2012 1120   BASOSABS 0.0 09/17/2008 1058    CMP     Component Value Date/Time   NA 141 03/08/2012 1014   NA 139 01/17/2012 1120   NA 140 09/17/2008 1058   K 4.3 03/08/2012 1014   K 3.7 01/17/2012 1120   K 4.3 09/17/2008 1058   CL 107 03/08/2012 1014   CL 101 01/17/2012 1120   CL 105 09/17/2008 1058   CO2 27 03/08/2012 1014   CO2 25 01/17/2012 1120   CO2 28 09/17/2008 1058   GLUCOSE 89 03/08/2012 1014   GLUCOSE 106* 01/17/2012 1120   GLUCOSE 97 09/17/2008 1058   BUN 14.0 03/08/2012 1014   BUN 12 01/17/2012 1120   BUN 21 09/17/2008 1058   CREATININE 0.8 03/08/2012 1014   CREATININE 0.79 01/17/2012  1120   CREATININE 1.2 09/17/2008 1058   CALCIUM 10.4 03/08/2012 1014   CALCIUM 10.7* 01/17/2012 1120   CALCIUM 9.9 09/17/2008 1058   PROT 7.3 03/08/2012 1014   PROT 7.2 05/11/2011 1355   PROT 7.6 09/17/2008 1058   ALBUMIN 4.0 03/08/2012 1014   ALBUMIN 4.7 05/11/2011 1355   AST 17 03/08/2012 1014   AST 20 05/11/2011 1355   AST 27 09/17/2008 1058   ALT 19 03/08/2012 1014   ALT 18 05/11/2011 1355  ALKPHOS 66 03/08/2012 1014   ALKPHOS 54 05/11/2011 1355   ALKPHOS 67 09/17/2008 1058   BILITOT 0.37 03/08/2012 1014   BILITOT 0.5 05/11/2011 1355   BILITOT 0.40 09/17/2008 1058   GFRNONAA 86* 01/17/2012 1120   GFRAA >90 01/17/2012 1120   ESOPHOGRAM/BARIUM SWALLOW   Technique:  Combined double contrast and single contrast examination performed using effervescent crystals, thick barium liquid, and thin barium liquid.   Fluoroscopy time:  2.5 minutes.   Comparison:  None.   Findings:  A double contrast barium swallow was performed.  The mucosa of the esophagus is unremarkable.  A single contrast study shows the swallowing mechanism to be normal.  There are mild tertiary contractions in the distal esophagus.  There does appear to be a small sliding hiatal hernia, which was difficult to image on spot films.  Moderate gastroesophageal reflux is demonstrated. A barium pill was given at the end of the study which did pass into the stomach with only slight delay.   IMPRESSION:   1.  Small sliding hiatal hernia with moderate gastroesophageal reflux. 2.  Barium pill slightly delays in passing into the stomach but does pass intact. 3.  Mild tertiary contractions  ASSESSMENT/PLAN:  67 year old female with a past medical history of breast cancer in the 1990s, cyclical neutropenia who is seen in consultation at the request of Dr. Jearld Fenton for evaluation of reflux.   1.  GERD/pill dysphagia -- she does not have significant heartburn or other GERD related symptoms. She also has previously tried PPI therapy  without significant benefit, which is not entirely surprising given that she does not have heartburn or dyspeptic symptoms.  Her barium swallow was reviewed today and we discussed it together.  It does show a transient delay at the GE junction and so she could have a small mucosal ring. We discussed upper endoscopy for dilation, but at this point she does not feel her pill dysphagia is impacting her life in any significant way. She is able to eat and drink food without issue. Therefore, at this point we will not proceed with upper endoscopy. I have asked that she let me know if she develops any other symptoms such as nausea, vomiting, worsening dysphagia, heartburn, early satiety. If this occurred, we would proceed upper endoscopy. She is happy with this plan.  We did briefly discuss esophageal dysmotility, given that her barium swallow showed mild tertiary contractions.  2.  CRC screening -- she is up to date with previous colonoscopy less than 10 years ago. She reports she will likely return to Dr. Laural Benes for screening colonoscopy at the 10 year interval as previously recommended.  She will return when necessary

## 2012-10-02 NOTE — Patient Instructions (Addendum)
Follow up with Dr. Dorena Bodo as needed                                               We are excited to introduce MyChart, a new best-in-class service that provides you online access to important information in your electronic medical record. We want to make it easier for you to view your health information - all in one secure location - when and where you need it. We expect MyChart will enhance the quality of care and service we provide.  When you register for MyChart, you can:    View your test results.    Request appointments and receive appointment reminders via email.    Request medication renewals.    View your medical history, allergies, medications and immunizations.    Communicate with your physician's office through a password-protected site.    Conveniently print information such as your medication lists.  To find out if MyChart is right for you, please talk to a member of our clinical staff today. We will gladly answer your questions about this free health and wellness tool.  If you are age 66 or older and want a member of your family to have access to your record, you must provide written consent by completing a proxy form available at our office. Please speak to our clinical staff about guidelines regarding accounts for patients younger than age 66.  As you activate your MyChart account and need any technical assistance, please call the MyChart technical support line at [phone]) or email your question to [email or web address]. If you email your question(s), please include your name, a return phone number and the best time to reach you.  If you have non-urgent health-related questions, you can send a message to our office through MyChart at [email or web address]. If you have a medical emergency, call 911.  Thank you for using MyChart as your new health and wellness resource!   MyChart licensed from [employer],  [id number]. Patents  Pending.

## 2013-02-06 ENCOUNTER — Telehealth: Payer: Self-pay | Admitting: Oncology

## 2013-02-06 NOTE — Telephone Encounter (Signed)
, °

## 2013-03-14 ENCOUNTER — Ambulatory Visit: Payer: Medicare Other | Admitting: Oncology

## 2013-03-14 ENCOUNTER — Other Ambulatory Visit: Payer: Medicare Other | Admitting: Lab

## 2013-03-19 ENCOUNTER — Telehealth: Payer: Self-pay | Admitting: Oncology

## 2013-03-19 ENCOUNTER — Encounter: Payer: Self-pay | Admitting: Oncology

## 2013-03-19 ENCOUNTER — Ambulatory Visit (HOSPITAL_BASED_OUTPATIENT_CLINIC_OR_DEPARTMENT_OTHER): Payer: Medicare Other | Admitting: Oncology

## 2013-03-19 ENCOUNTER — Other Ambulatory Visit (HOSPITAL_BASED_OUTPATIENT_CLINIC_OR_DEPARTMENT_OTHER): Payer: Medicare Other | Admitting: Lab

## 2013-03-19 VITALS — BP 133/78 | HR 77 | Temp 98.7°F | Resp 20 | Ht 65.0 in | Wt 150.3 lb

## 2013-03-19 DIAGNOSIS — D704 Cyclic neutropenia: Secondary | ICD-10-CM

## 2013-03-19 DIAGNOSIS — C50919 Malignant neoplasm of unspecified site of unspecified female breast: Secondary | ICD-10-CM

## 2013-03-19 DIAGNOSIS — Z853 Personal history of malignant neoplasm of breast: Secondary | ICD-10-CM

## 2013-03-19 DIAGNOSIS — Z9010 Acquired absence of unspecified breast and nipple: Secondary | ICD-10-CM

## 2013-03-19 LAB — CBC WITH DIFFERENTIAL/PLATELET
BASO%: 1.2 % (ref 0.0–2.0)
Basophils Absolute: 0 10*3/uL (ref 0.0–0.1)
EOS%: 1.6 % (ref 0.0–7.0)
HCT: 41.7 % (ref 34.8–46.6)
HGB: 13.5 g/dL (ref 11.6–15.9)
MCH: 29.4 pg (ref 25.1–34.0)
MCHC: 32.3 g/dL (ref 31.5–36.0)
MCV: 91.1 fL (ref 79.5–101.0)
MONO%: 11.2 % (ref 0.0–14.0)
NEUT%: 44.3 % (ref 38.4–76.8)

## 2013-03-19 NOTE — Progress Notes (Signed)
Hematology and Oncology Follow Up Visit  Shelley Gutierrez [id number] [birth year] 67 y.o. 03/19/2013 1:25 PM   DIAGNOSIS:   Encounter Diagnoses  Name Primary?  . Cyclical neutropenia Yes  . Breast cancer, unspecified laterality      PAST THERAPY: #1 diagnosis of breast cancer 1996. She underwent a mastectomy of the right breast with axillary lymph node dissection. The tumor was node negative. She had immediate TRAM reconstruction.  #2 cyclical neutropenia leukopenia. Currently on observation only.  Current therapy:  #1 breast cancer observation  #2: Cyclical neutropenia: Observation.   Interim History:  Patient is seen in followup today. Clinically she is doing well. She does tell me that about a month ago she developed vertigo and she was seen in the emergency room she still occasionally develops the symptoms but overall is doing well she denies any fevers chills night sweats headaches no shortness of breath no chest pains no palpitations no recent infections no hospitalizations. Remainder of the 10 point review of systems is negative.  Medications: I have reviewed the patient's current medications.  Allergies:  Allergies  Allergen Reactions  . Penicillins Other (See Comments)    Pt does not tolerate smell    Past Medical History, Surgical history, Social history, and Family History were reviewed and updated.  Review of Systems: Constitutional:  Negative for fever, chills, night sweats, anorexia, weight loss, pain. Cardiovascular: no chest pain or dyspnea on exertion Respiratory: no cough, shortness of breath, or wheezing Neurological: no TIA or stroke symptoms Dermatological: negative ENT: negative Skin Gastrointestinal: no abdominal pain, change in bowel habits, or black or bloody stools Genito-Urinary: no dysuria, trouble voiding, or hematuria Hematological and Lymphatic: No cervical supraclavicular or  axillary lymphadenopathy. Breast: Patient has a reconstructed  right breast the surgical scar looks well healed. Left breast no masses nipple discharge no skin changes no nipple retraction. Musculoskeletal: negative Remaining ROS negative.  Physical Exam:  Blood pressure 133/78, pulse 77, temperature 98.7 F (37.1 C), temperature source Oral, resp. rate 20, height 5\' 5"  (1.651 m), weight 150 lb 4.8 oz (68.176 kg). General appearance: alert, cooperative and appears stated age  HEENT exam: EOMI PERRLA sclerae anicteric no conjunctival pallor oral mucosa is moist neck is supple no palpable cervical supraclavicular or axillary adenopathy  Lungs: Clear bilaterally to auscultation and percussion  Cardiovascular: Regular rate rhythm no murmurs gallops or rubs  Abdomen soft nontender nondistended bowel sounds are present no hepatosplenomegaly  extremities no clubbing edema or cyanosis  Neuro: Patient is alert oriented x3 DTRs are +4 motor and sensory is intact strength is symmetrical in upper and lower extremities.   Skin: No rashes.  Breast: Right breast is reconstructed there is no masses no skin changes. Left breast no masses nipple discharge no skin changes retraction no masses.   Lab Results: Lab Results  Component Value Date   WBC 3.6* 03/19/2013   HGB 13.5 03/19/2013   HCT 41.7 03/19/2013   MCV 91.1 03/19/2013   PLT 209 03/19/2013     Chemistry      Component Value Date/Time   NA 141 03/08/2012 1014   NA 139 01/17/2012 1120   NA 140 09/17/2008 1058   K 4.3 03/08/2012 1014   K 3.7 01/17/2012 1120   K 4.3 09/17/2008 1058   CL 107 03/08/2012 1014   CL 101 01/17/2012 1120   CL 105 09/17/2008 1058   CO2 27 03/08/2012 1014   CO2 25 01/17/2012 1120   CO2 28 09/17/2008 1058  BUN 14.0 03/08/2012 1014   BUN 12 01/17/2012 1120   BUN 21 09/17/2008 1058   CREATININE 0.8 03/08/2012 1014   CREATININE 0.79 01/17/2012 1120   CREATININE 1.2 09/17/2008 1058      Component Value Date/Time   CALCIUM 10.4 03/08/2012 1014   CALCIUM 10.7* 01/17/2012 1120    CALCIUM 9.9 09/17/2008 1058   ALKPHOS 66 03/08/2012 1014   ALKPHOS 54 05/11/2011 1355   ALKPHOS 67 09/17/2008 1058   AST 17 03/08/2012 1014   AST 20 05/11/2011 1355   AST 27 09/17/2008 1058   ALT 19 03/08/2012 1014   ALT 18 05/11/2011 1355   ALT 30 09/17/2008 1058   BILITOT 0.37 03/08/2012 1014   BILITOT 0.5 05/11/2011 1355   BILITOT 0.40 09/17/2008 1058       Radiological Studies:  No results found.   IMPRESSIONS AND PLAN: A 67 y.o. female with   #1 patient with history of breast cancer 1996 status post mastectomy with reconstruction. She has no clinical evidence of disease. She will proceed with getting her mammograms as scheduled on a yearly basis of the left breast.  #2 leukopenia stable we will continue to monitor her.  #3 patient will be seen back in one years time.   she knows to call me with any problems questions or concerns. I spent about 30 minutes with the patient greater than 50% of the time was spent in counseling and coordination of care    Drue Second, MD Medical/Oncology [facility] [phone] (beeper) [phone] (Office)  03/19/2013, 1:25 PM

## 2013-03-19 NOTE — Telephone Encounter (Signed)
, °

## 2013-03-21 ENCOUNTER — Other Ambulatory Visit: Payer: Self-pay

## 2013-03-21 DIAGNOSIS — Z1231 Encounter for screening mammogram for malignant neoplasm of breast: Secondary | ICD-10-CM

## 2013-03-21 DIAGNOSIS — Z9011 Acquired absence of right breast and nipple: Secondary | ICD-10-CM

## 2013-05-15 ENCOUNTER — Ambulatory Visit
Admission: RE | Admit: 2013-05-15 | Discharge: 2013-05-15 | Disposition: A | Discharge status: Home / Self Care | Payer: Medicare Other | Source: Ambulatory Visit

## 2013-05-15 DIAGNOSIS — Z9011 Acquired absence of right breast and nipple: Secondary | ICD-10-CM

## 2013-05-15 DIAGNOSIS — Z1231 Encounter for screening mammogram for malignant neoplasm of breast: Secondary | ICD-10-CM

## 2013-05-16 ENCOUNTER — Other Ambulatory Visit (HOSPITAL_COMMUNITY)
Admission: RE | Admit: 2013-05-16 | Discharge: 2013-05-16 | Disposition: A | Discharge status: Home / Self Care | Payer: Medicare Other | Source: Ambulatory Visit | Attending: Obstetrics and Gynecology | Admitting: Obstetrics and Gynecology

## 2013-05-16 ENCOUNTER — Other Ambulatory Visit: Payer: Self-pay | Admitting: Obstetrics and Gynecology

## 2013-05-16 DIAGNOSIS — Z124 Encounter for screening for malignant neoplasm of cervix: Secondary | ICD-10-CM | POA: Insufficient documentation

## 2013-11-10 IMAGING — MG MM DIGITAL SCREENING BILAT W/ CAD
3 series · 3 of 3 positions shown · non-contrast
Comparison: Prior studies.

DG SCREEN MAMMOGRAM BILATERAL
Bilateral CC and MLO view(s) were taken.
Prior study comparison: February 12, 2008, DG screen mammogram bilateral.

DIGITAL SCREENING MAMMOGRAM WITH CAD:

[L CC]
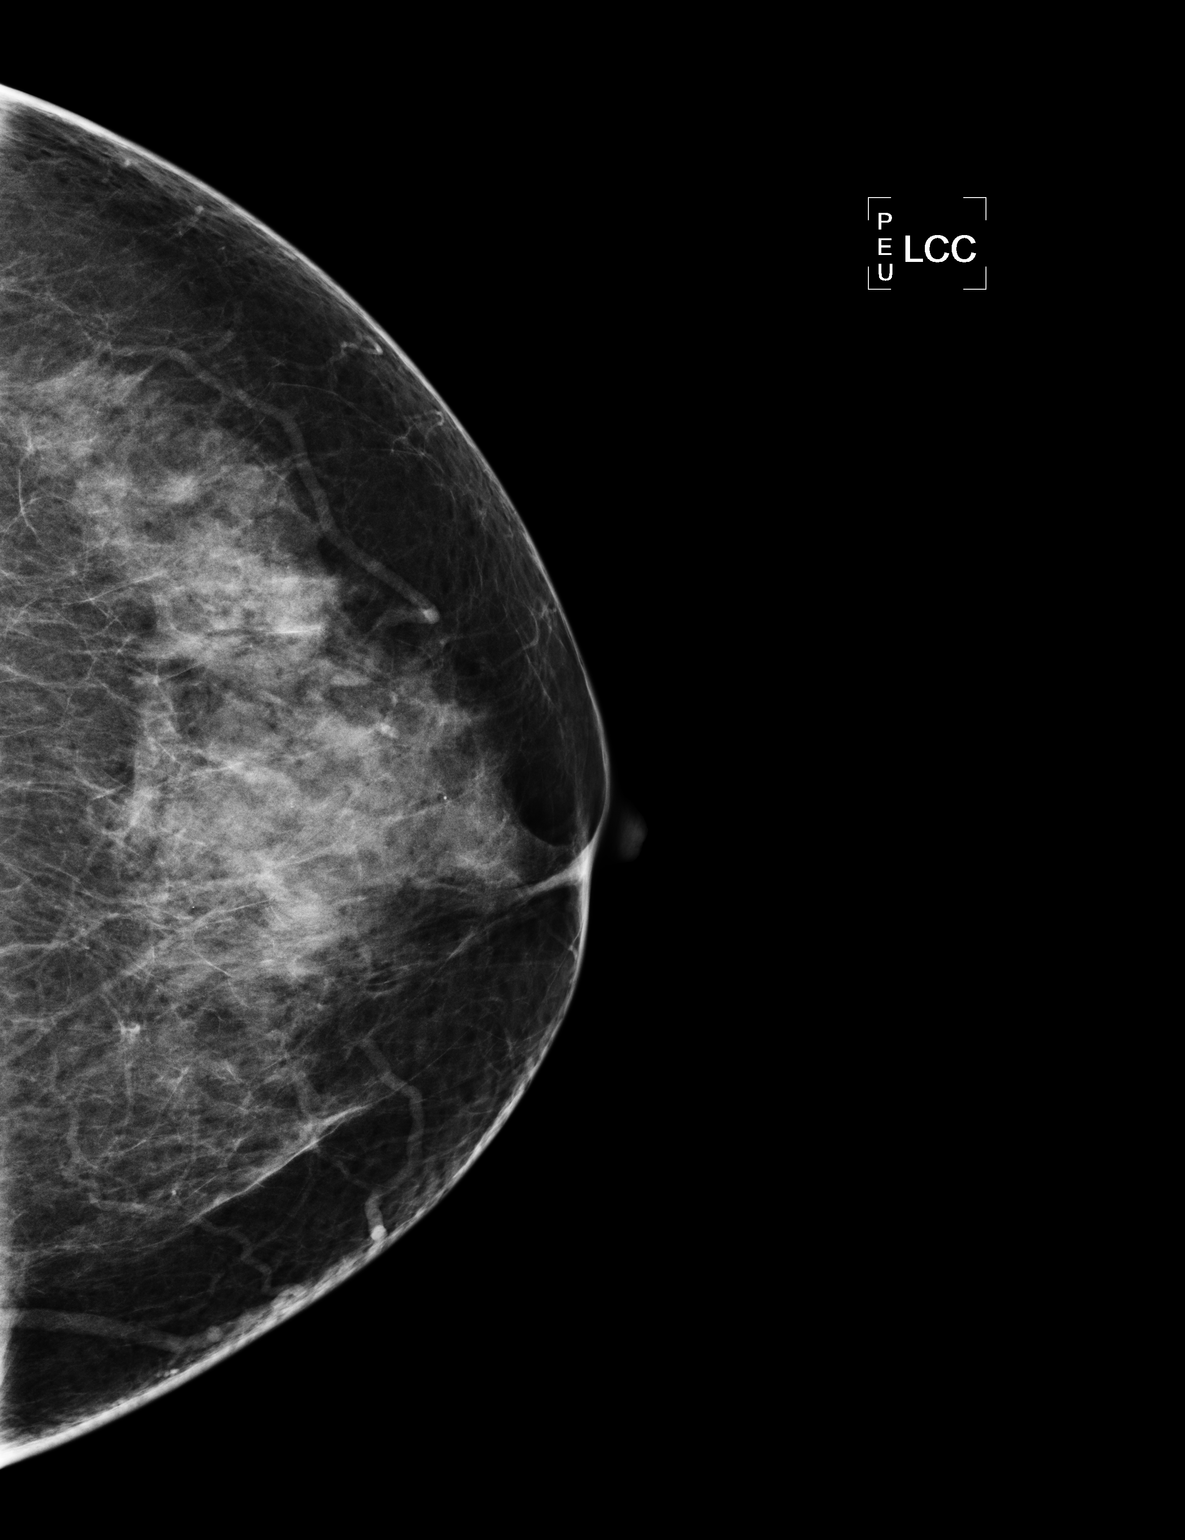

[L MLO]
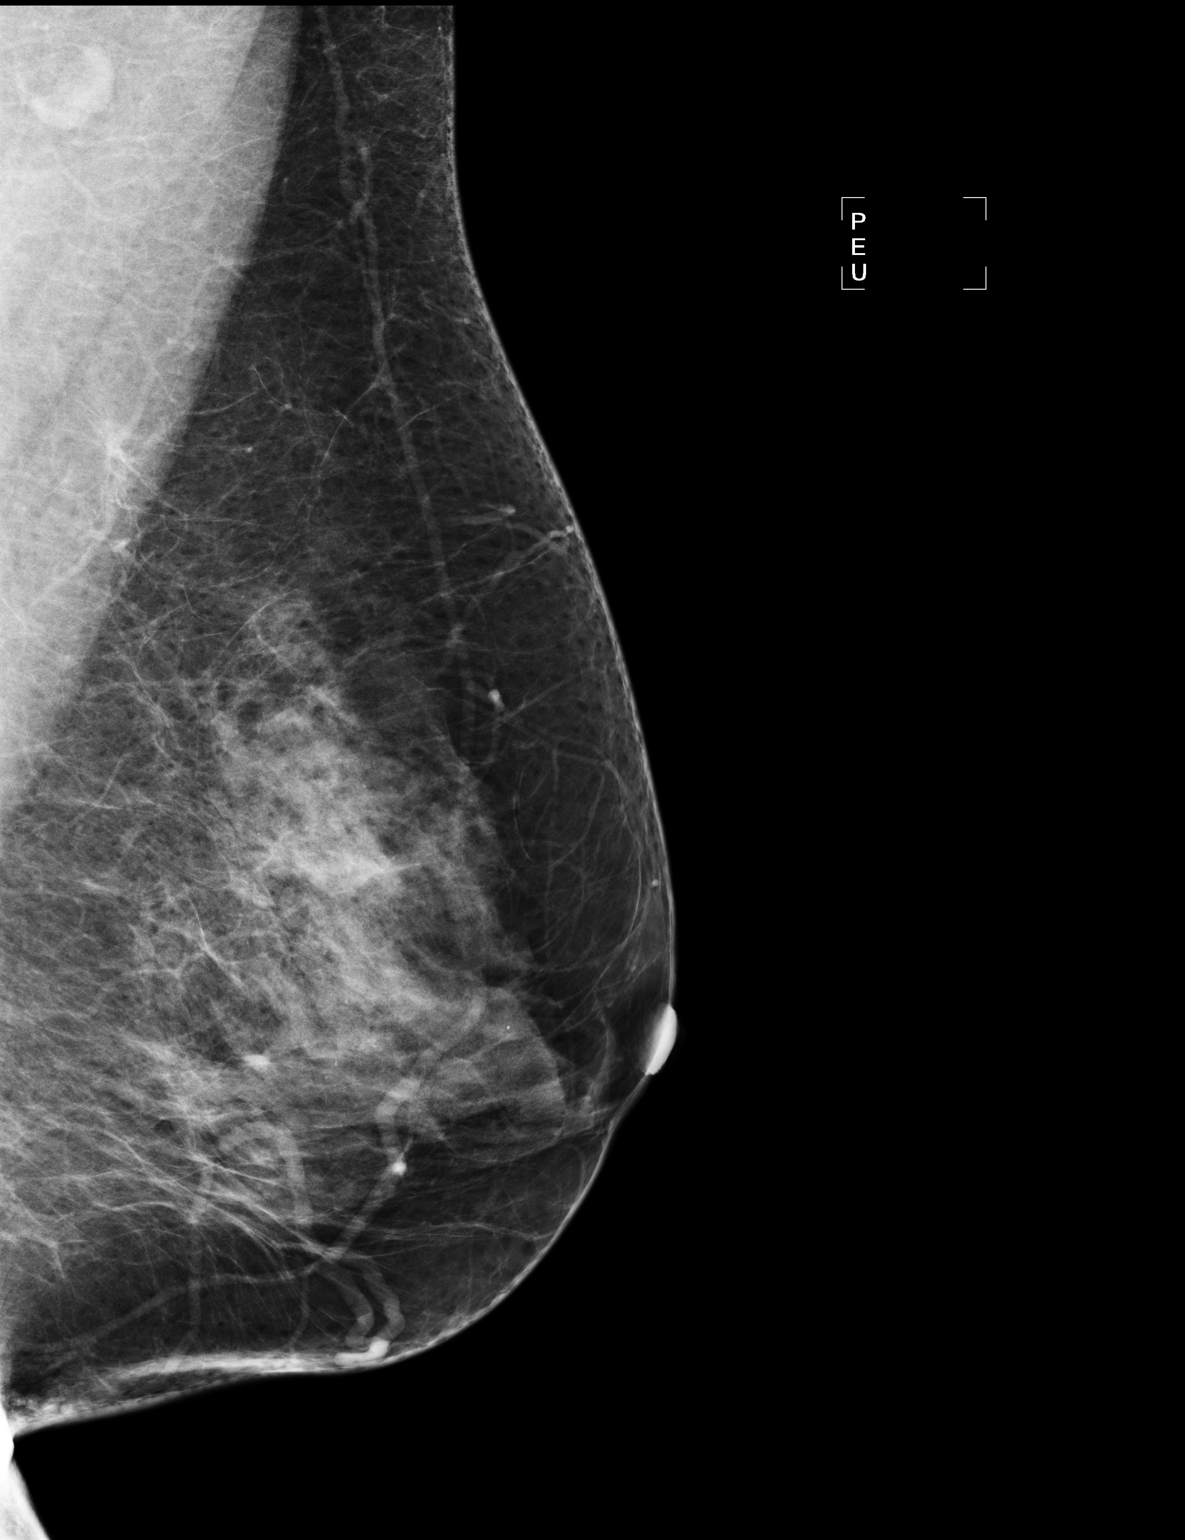

[R MLO]
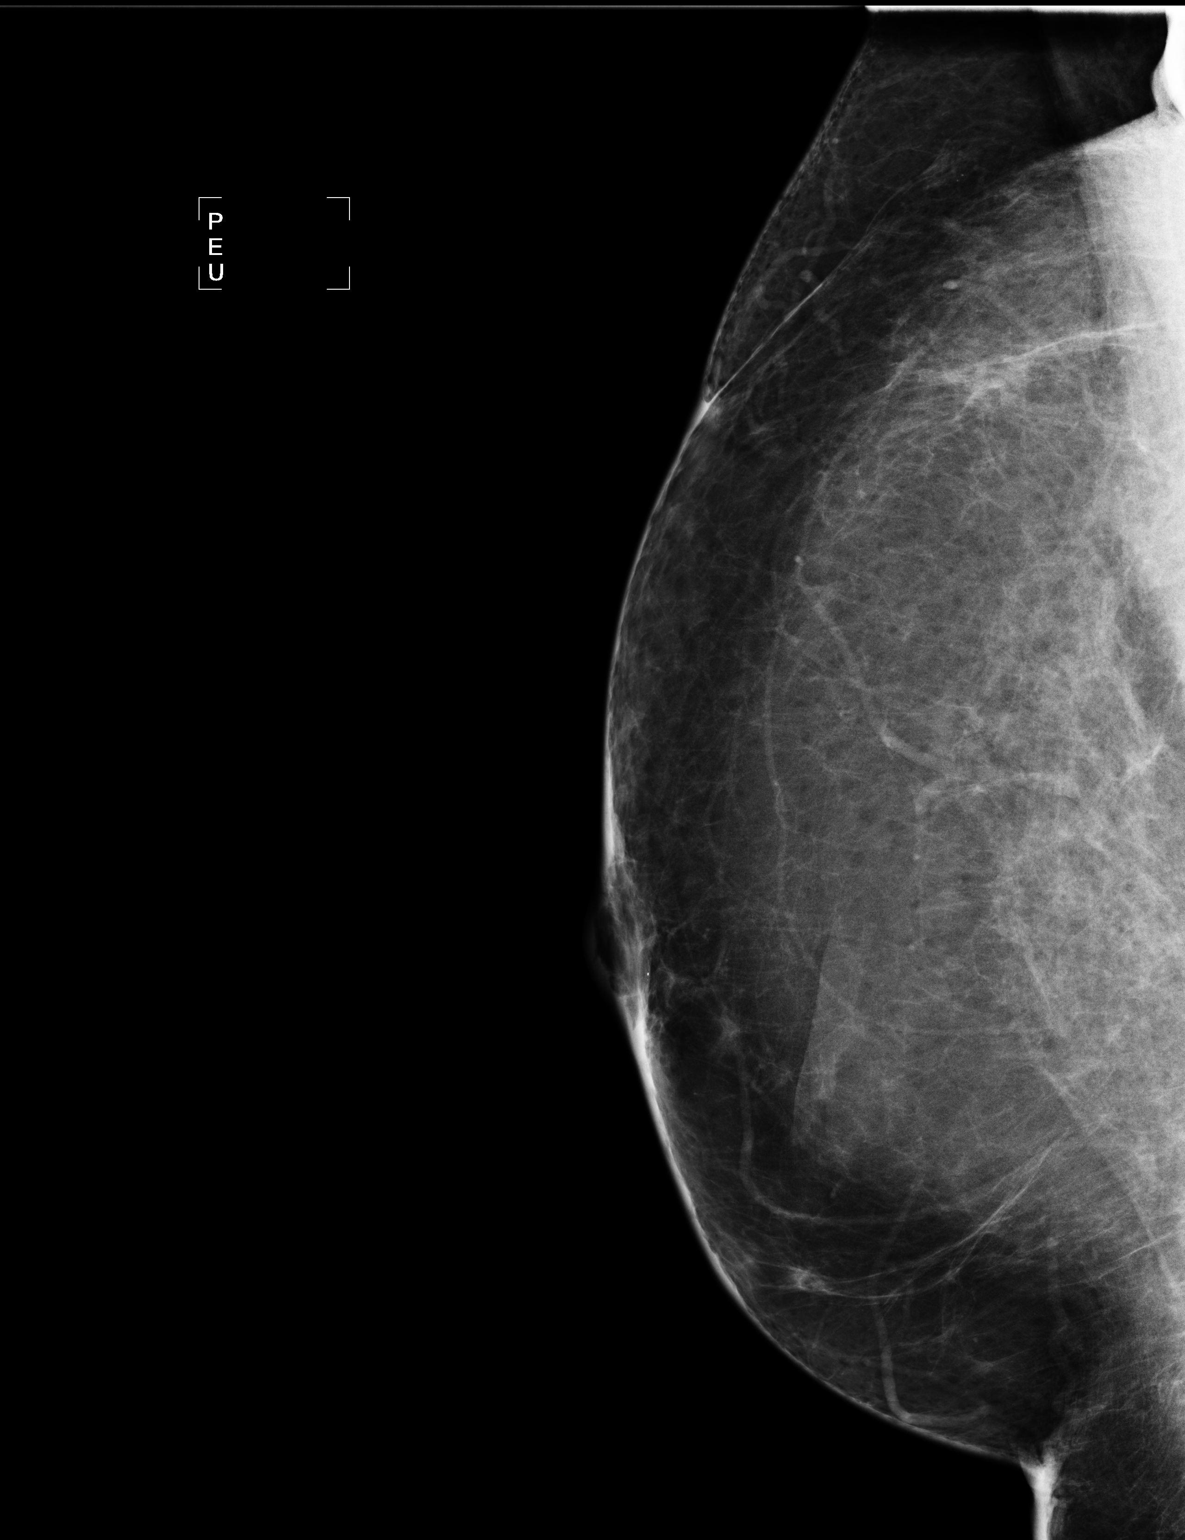

[3 of 3 positions shown; findings below may reference images not displayed]

The breast tissue is heterogeneously dense.  There is no abnormality in the right TRAM 
reconstruction.  There is no dominant mass, architectural distortion or calcification to suggest 
malignancy.

Images were processed with CAD.
IMPRESSION: No mammographic evidence of malignancy.  Suggest yearly screening mammography.

A result letter of this screening mammogram will be mailed directly to the patient.

ASSESSMENT: Negative - BI-RADS 1

Screening mammogram in 1 year.
,

## 2014-01-24 ENCOUNTER — Telehealth: Payer: Self-pay | Admitting: Hematology and Oncology

## 2014-01-24 NOTE — Telephone Encounter (Signed)
lmonvm for pt re appt for lb/vg 12/21. schedule mailed.

## 2014-02-23 ENCOUNTER — Telehealth: Payer: Self-pay | Admitting: Hematology and Oncology

## 2014-02-23 NOTE — Telephone Encounter (Signed)
, °

## 2014-03-20 ENCOUNTER — Ambulatory Visit: Payer: Medicare Other | Admitting: Oncology

## 2014-03-20 ENCOUNTER — Other Ambulatory Visit: Payer: Medicare Other

## 2014-04-20 ENCOUNTER — Ambulatory Visit: Payer: Medicare Other | Admitting: Hematology and Oncology

## 2014-04-20 ENCOUNTER — Other Ambulatory Visit: Payer: Medicare Other

## 2014-05-05 ENCOUNTER — Other Ambulatory Visit: Payer: Self-pay

## 2014-05-05 DIAGNOSIS — Z1231 Encounter for screening mammogram for malignant neoplasm of breast: Secondary | ICD-10-CM

## 2014-05-20 ENCOUNTER — Ambulatory Visit
Admission: RE | Admit: 2014-05-20 | Discharge: 2014-05-20 | Disposition: A | Discharge status: Home / Self Care | Payer: Medicare Other | Source: Ambulatory Visit

## 2014-05-20 DIAGNOSIS — Z1231 Encounter for screening mammogram for malignant neoplasm of breast: Secondary | ICD-10-CM

## 2014-09-16 ENCOUNTER — Other Ambulatory Visit: Payer: Self-pay

## 2014-09-16 DIAGNOSIS — C50919 Malignant neoplasm of unspecified site of unspecified female breast: Secondary | ICD-10-CM

## 2014-09-17 ENCOUNTER — Telehealth: Payer: Self-pay | Admitting: Hematology and Oncology

## 2014-09-17 ENCOUNTER — Ambulatory Visit (HOSPITAL_BASED_OUTPATIENT_CLINIC_OR_DEPARTMENT_OTHER): Payer: Medicare Other | Admitting: Hematology and Oncology

## 2014-09-17 ENCOUNTER — Other Ambulatory Visit (HOSPITAL_BASED_OUTPATIENT_CLINIC_OR_DEPARTMENT_OTHER): Payer: Medicare Other

## 2014-09-17 VITALS — BP 142/83 | HR 71 | Temp 98.0°F | Resp 18 | Ht 65.0 in | Wt 146.7 lb

## 2014-09-17 DIAGNOSIS — C50911 Malignant neoplasm of unspecified site of right female breast: Secondary | ICD-10-CM

## 2014-09-17 DIAGNOSIS — Z853 Personal history of malignant neoplasm of breast: Secondary | ICD-10-CM

## 2014-09-17 DIAGNOSIS — C50919 Malignant neoplasm of unspecified site of unspecified female breast: Secondary | ICD-10-CM

## 2014-09-17 DIAGNOSIS — D704 Cyclic neutropenia: Secondary | ICD-10-CM

## 2014-09-17 LAB — COMPREHENSIVE METABOLIC PANEL (CC13)
ALT: 19 U/L (ref 0–55)
ANION GAP: 9 meq/L (ref 3–11)
AST: 17 U/L (ref 5–34)
Albumin: 4 g/dL (ref 3.5–5.0)
Alkaline Phosphatase: 70 U/L (ref 40–150)
BUN: 11.9 mg/dL (ref 7.0–26.0)
CALCIUM: 10 mg/dL (ref 8.4–10.4)
CHLORIDE: 106 meq/L (ref 98–109)
CO2: 27 mEq/L (ref 22–29)
CREATININE: 0.8 mg/dL (ref 0.6–1.1)
EGFR: 84 mL/min/{1.73_m2} — ABNORMAL LOW (ref >90)
Glucose: 98 mg/dl (ref 70–140)
Potassium: 4.2 mEq/L (ref 3.5–5.1)
Sodium: 142 mEq/L (ref 136–145)
Total Bilirubin: 0.46 mg/dL (ref 0.20–1.20)
Total Protein: 7.1 g/dL (ref 6.4–8.3)

## 2014-09-17 LAB — CBC WITH DIFFERENTIAL/PLATELET
BASO%: 0.6 % (ref 0.0–2.0)
Basophils Absolute: 0 10*3/uL (ref 0.0–0.1)
EOS ABS: 0.1 10*3/uL (ref 0.0–0.5)
EOS%: 1.5 % (ref 0.0–7.0)
HCT: 42 % (ref 34.8–46.6)
HEMOGLOBIN: 14 g/dL (ref 11.6–15.9)
LYMPH%: 41.8 % (ref 14.0–49.7)
MCH: 30.5 pg (ref 25.1–34.0)
MCHC: 33.3 g/dL (ref 31.5–36.0)
MCV: 91.5 fL (ref 79.5–101.0)
MONO#: 0.3 10*3/uL (ref 0.1–0.9)
MONO%: 9.8 % (ref 0.0–14.0)
NEUT%: 46.3 % (ref 38.4–76.8)
NEUTROS ABS: 1.6 10*3/uL (ref 1.5–6.5)
Platelets: 215 10*3/uL (ref 145–400)
RBC: 4.59 10*6/uL (ref 3.70–5.45)
RDW: 12.8 % (ref 11.2–14.5)
WBC: 3.4 10*3/uL — ABNORMAL LOW (ref 3.9–10.3)
lymph#: 1.4 10*3/uL (ref 0.9–3.3)

## 2014-09-17 NOTE — Assessment & Plan Note (Signed)
Right breast cancer diagnosed in 1996 status post right mastectomy with axillary lymph node dissection with immediate TRAM reconstruction (I do not have any proper records of pathological staging or receptor status)  Breast Cancer Surveillance: 1. Breast exam 09/17/2014: Normal 2. Mammogram 05/25/2014 No abnormalities. Postsurgical changes. Breast Density Category B. I recommended that she get 3-D mammograms for surveillance. Discussed the differences between different breast density categories.   Survivorship: Discussed the importance of physical exercise in decreasing the likelihood of breast cancer recurrence. Recommended 30 mins daily 6 days a week of either brisk walking or cycling or swimming. Encouraged patient to eat more fruits and vegetables and decrease red meat.   Cyclical neutropenia: Under observation. 1 year follow-up on this with labs  Return to clinic in 1 year for follow-up with survivorship nurse practitioner

## 2014-09-17 NOTE — Telephone Encounter (Signed)
Gave avs & calendar for June 2017 °

## 2014-09-17 NOTE — Progress Notes (Signed)
Patient Care Team: Aretta Nip, MD as PCP - General (Family Medicine)  DIAGNOSIS: Breast cancer right breast diagnosed in 1996, mild leukopenia  CHIEF COMPLIANT: Follow-up of right breast cancer  INTERVAL HISTORY: Shelley Gutierrez is a 69 year old with above-mentioned history of right-sided breast cancer currently under surveillance. She is doing very well without any problems or concerns. She has had low white count for the last couple of years and Dr. Rufina Falco has been watching it. There is no indication for any further investigation or procedures. She does not have any increased rate of infections.  REVIEW OF SYSTEMS:   Constitutional: Denies fevers, chills or abnormal weight loss Eyes: Denies blurriness of vision Ears, nose, mouth, throat, and face: Denies mucositis or sore throat Respiratory: Denies cough, dyspnea or wheezes Cardiovascular: Denies palpitation, chest discomfort or lower extremity swelling Gastrointestinal:  Denies nausea, heartburn or change in bowel habits Skin: Denies abnormal skin rashes Lymphatics: Denies new lymphadenopathy or easy bruising Neurological:Denies numbness, tingling or new weaknesses Behavioral/Psych: Mood is stable, no new changes  Breast:  denies any pain or lumps or nodules in either breasts All other systems were reviewed with the patient and are negative.  I have reviewed the past medical history, past surgical history, social history and family history with the patient and they are unchanged from previous note.  ALLERGIES:  is allergic to penicillins.  MEDICATIONS:  Current Outpatient Prescriptions  Medication Sig Dispense Refill  . aspirin EC 81 MG tablet Take 81 mg by mouth daily.    . calcium-vitamin D (OSCAL WITH D) 500-200 MG-UNIT per tablet Take 1 tablet by mouth daily.    Marland Kitchen loratadine (CLARITIN) 10 MG tablet Take 10 mg by mouth as needed.    . meclizine (ANTIVERT) 50 MG tablet Take 1 tablet (50 mg total) by mouth 3 (three) times  daily as needed. 30 tablet 0  . Multiple Vitamin (MULTIVITAMIN WITH MINERALS) TABS Take 1 tablet by mouth daily.    . pravastatin (PRAVACHOL) 40 MG tablet Take 40 mg by mouth daily.    . valsartan (DIOVAN) 40 MG tablet Take 40 mg by mouth daily.    . vitamin C (ASCORBIC ACID) 500 MG tablet Take 500 mg by mouth daily.     No current facility-administered medications for this visit.    PHYSICAL EXAMINATION: ECOG PERFORMANCE STATUS: 0 - Asymptomatic  Filed Vitals:   09/17/14 1050  BP: 142/83  Pulse: 71  Temp: 98 F (36.7 C)  Resp: 18   Filed Weights   09/17/14 1050  Weight: 146 lb 11.2 oz (66.543 kg)    GENERAL:alert, no distress and comfortable SKIN: skin color, texture, turgor are normal, no rashes or significant lesions EYES: normal, Conjunctiva are pink and non-injected, sclera clear OROPHARYNX:no exudate, no erythema and lips, buccal mucosa, and tongue normal  NECK: supple, thyroid normal size, non-tender, without nodularity LYMPH:  no palpable lymphadenopathy in the cervical, axillary or inguinal LUNGS: clear to auscultation and percussion with normal breathing effort HEART: regular rate & rhythm and no murmurs and no lower extremity edema ABDOMEN:abdomen soft, non-tender and normal bowel sounds Musculoskeletal:no cyanosis of digits and no clubbing  NEURO: alert & oriented x 3 with fluent speech, no focal motor/sensory deficits BREAST: No palpable masses or nodules in either right or left breasts. No palpable axillary supraclavicular or infraclavicular adenopathy no breast tenderness or nipple discharge. (exam performed in the presence of a chaperone)  LABORATORY DATA:  I have reviewed the data as listed  Chemistry      Component Value Date/Time   NA 141 03/08/2012 1014   NA 139 01/17/2012 1120   NA 140 09/17/2008 1058   K 4.3 03/08/2012 1014   K 3.7 01/17/2012 1120   K 4.3 09/17/2008 1058   CL 107 03/08/2012 1014   CL 101 01/17/2012 1120   CL 105 09/17/2008  1058   CO2 27 03/08/2012 1014   CO2 25 01/17/2012 1120   CO2 28 09/17/2008 1058   BUN 14.0 03/08/2012 1014   BUN 12 01/17/2012 1120   BUN 21 09/17/2008 1058   CREATININE 0.8 03/08/2012 1014   CREATININE 0.79 01/17/2012 1120   CREATININE 1.2 09/17/2008 1058      Component Value Date/Time   CALCIUM 10.4 03/08/2012 1014   CALCIUM 10.7* 01/17/2012 1120   CALCIUM 9.9 09/17/2008 1058   ALKPHOS 66 03/08/2012 1014   ALKPHOS 54 05/11/2011 1355   ALKPHOS 67 09/17/2008 1058   AST 17 03/08/2012 1014   AST 20 05/11/2011 1355   AST 27 09/17/2008 1058   ALT 19 03/08/2012 1014   ALT 18 05/11/2011 1355   ALT 30 09/17/2008 1058   BILITOT 0.37 03/08/2012 1014   BILITOT 0.5 05/11/2011 1355   BILITOT 0.40 09/17/2008 1058       Lab Results  Component Value Date   WBC 3.4* 09/17/2014   HGB 14.0 09/17/2014   HCT 42.0 09/17/2014   MCV 91.5 09/17/2014   PLT 215 09/17/2014   NEUTROABS 1.6 09/17/2014     RADIOGRAPHIC STUDIES: I have personally reviewed the radiology reports and agreed with their findings. Mammogram January 2016 is normal  ASSESSMENT & PLAN:  Breast cancer, right breast Right breast cancer diagnosed in 1996 status post right mastectomy with axillary lymph node dissection with immediate TRAM reconstruction (I do not have any proper records of pathological staging or receptor status)  Breast Cancer Surveillance: 1. Breast exam 09/17/2014: Normal 2. Mammogram 05/25/2014 No abnormalities. Postsurgical changes. Breast Density Category B. I recommended that she get 3-D mammograms for surveillance. Discussed the differences between different breast density categories.   Survivorship: Discussed the importance of physical exercise in decreasing the likelihood of breast cancer recurrence. Recommended 30 mins daily 6 days a week of either brisk walking or cycling or swimming. Encouraged patient to eat more fruits and vegetables and decrease red meat.   Cyclical neutropenia: Under  observation. 1 year follow-up on this with labs, WBC count 3.4 with an [employer] of 1600 significant change from last year  Return to clinic in 1 year for follow-up with survivorship nurse practitioner    No orders of the defined types were placed in this encounter.   The patient has a good understanding of the overall plan. she agrees with it. she will call with any problems that may develop before the next visit here.   Rulon Eisenmenger, MD

## 2014-12-24 ENCOUNTER — Other Ambulatory Visit: Payer: Self-pay | Admitting: Gastroenterology

## 2014-12-31 ENCOUNTER — Encounter (HOSPITAL_COMMUNITY): Payer: Self-pay | Admitting: *Deleted

## 2015-01-12 ENCOUNTER — Ambulatory Visit (HOSPITAL_COMMUNITY)
Admission: RE | Admit: 2015-01-12 | Discharge: 2015-01-12 | Disposition: A | Discharge status: Home / Self Care | Payer: Medicare Other | Source: Ambulatory Visit | Attending: Gastroenterology | Admitting: Gastroenterology

## 2015-01-12 ENCOUNTER — Encounter (HOSPITAL_COMMUNITY)
Admission: RE | Disposition: A | Discharge status: Home / Self Care | Payer: Self-pay | Source: Ambulatory Visit | Attending: Gastroenterology

## 2015-01-12 ENCOUNTER — Ambulatory Visit (HOSPITAL_COMMUNITY): Payer: Medicare Other | Admitting: Registered Nurse

## 2015-01-12 ENCOUNTER — Encounter (HOSPITAL_COMMUNITY): Payer: Self-pay

## 2015-01-12 DIAGNOSIS — D125 Benign neoplasm of sigmoid colon: Secondary | ICD-10-CM | POA: Diagnosis not present

## 2015-01-12 DIAGNOSIS — K219 Gastro-esophageal reflux disease without esophagitis: Secondary | ICD-10-CM | POA: Insufficient documentation

## 2015-01-12 DIAGNOSIS — Z79899 Other long term (current) drug therapy: Secondary | ICD-10-CM | POA: Insufficient documentation

## 2015-01-12 DIAGNOSIS — Z1211 Encounter for screening for malignant neoplasm of colon: Secondary | ICD-10-CM | POA: Diagnosis present

## 2015-01-12 DIAGNOSIS — E780 Pure hypercholesterolemia: Secondary | ICD-10-CM | POA: Diagnosis not present

## 2015-01-12 DIAGNOSIS — Z9011 Acquired absence of right breast and nipple: Secondary | ICD-10-CM | POA: Diagnosis not present

## 2015-01-12 DIAGNOSIS — I10 Essential (primary) hypertension: Secondary | ICD-10-CM | POA: Diagnosis not present

## 2015-01-12 DIAGNOSIS — Z853 Personal history of malignant neoplasm of breast: Secondary | ICD-10-CM | POA: Insufficient documentation

## 2015-01-12 HISTORY — PX: COLONOSCOPY WITH PROPOFOL: SHX5780

## 2015-01-12 HISTORY — DX: Gastro-esophageal reflux disease without esophagitis: K21.9

## 2015-01-12 SURGERY — COLONOSCOPY WITH PROPOFOL
Anesthesia: Monitor Anesthesia Care

## 2015-01-12 MED ORDER — PROPOFOL 10 MG/ML IV BOLUS
INTRAVENOUS | Status: AC
  Filled 2015-01-12: qty 20

## 2015-01-12 MED ORDER — LACTATED RINGERS IV SOLN: INTRAVENOUS | Status: DC

## 2015-01-12 MED ORDER — FENTANYL CITRATE (PF) 100 MCG/2ML IJ SOLN: 25.0000 ug | INTRAMUSCULAR | Status: DC | PRN

## 2015-01-12 MED ORDER — PROPOFOL INFUSION 10 MG/ML OPTIME
INTRAVENOUS | Status: DC | PRN
  Administered 2015-01-12: 130 ug/kg/min via INTRAVENOUS

## 2015-01-12 MED ORDER — PROPOFOL 10 MG/ML IV BOLUS
INTRAVENOUS | Status: DC | PRN
  Administered 2015-01-12: 20 mg via INTRAVENOUS
  Administered 2015-01-12: 30 mg via INTRAVENOUS

## 2015-01-12 MED ORDER — LIDOCAINE HCL (CARDIAC) 20 MG/ML IV SOLN
INTRAVENOUS | Status: DC | PRN
  Administered 2015-01-12: 50 mg via INTRAVENOUS

## 2015-01-12 MED ORDER — SODIUM CHLORIDE 0.9 % IV SOLN: INTRAVENOUS | Status: DC

## 2015-01-12 MED ORDER — LACTATED RINGERS IV SOLN
INTRAVENOUS | Status: DC
  Administered 2015-01-12: 125 mL via INTRAVENOUS

## 2015-01-12 SURGICAL SUPPLY — 21 items

## 2015-01-12 NOTE — Anesthesia Procedure Notes (Signed)
Procedure Name: MAC Date/Time: 01/12/2015 8:15 AM Performed by: Carleene Cooper A Pre-anesthesia Checklist: Timeout performed, Patient identified, Emergency Drugs available, Suction available and Patient being monitored Patient Re-evaluated:Patient Re-evaluated prior to inductionOxygen Delivery Method: Simple face mask Dental Injury: Teeth and Oropharynx as per pre-operative assessment

## 2015-01-12 NOTE — Op Note (Signed)
Procedure: Screening colonoscopy. Normal screening colonoscopy performed on 10/05/2004  Endoscopist: Earle Gell  Premedication: Propofol administered by anesthesia  Procedure: The patient was placed in the left lateral decubitus position. Anal inspection and digital rectal exam were normal. The Pentax pediatric colonoscope was introduced into the rectum and advanced to the cecum. A normal-appearing ileocecal valve and appendiceal orifice were taken. Colonic preparation exam today was good. Withdrawal time was 13 minutes  Rectum. Normal. Retroflexed view of the distal rectum was normal  Sigmoid colon. From the mid sigmoid colon, a 3 mm sessile polyp was removed with the cold biopsy forceps  Descending colon. Normal  Splenic flexure. Normal  Transverse colon. Normal  Hepatic flexure. Normal  Ascending colon. Normal  Cecum and ileocecal valve. Normal  Assessment: A diminutive polyp was removed from the mid sigmoid colon. Otherwise normal colonoscopy.  Recommendation: If the sigmoid colon polyp returns adenomatous pathologically, a repeat colonoscopy should be performed in 5 years.

## 2015-01-12 NOTE — H&P (Signed)
  Procedure: Screening colonoscopy. Normal screening colonoscopy performed 10/05/2004  History: The patient is a 69 year old female born 09/18/1945. She is scheduled to undergo a repeat screening colonoscopy.  Past medical history: Right mastectomy performed in 1996 to treat breast cancer. Hypercholesterolemia. Hypertension. Foot surgery.  Medication allergies: None  Family history: Negative for colon cancer  Exam: The patient is alert and lying comfortably on the endoscopy stretcher. Abdomen is soft and nontender to palpation. Lungs are clear to auscultation. Cardiac exam reveals a regular rhythm.  Plan: Proceed with screening colonoscopy

## 2015-01-12 NOTE — Anesthesia Preprocedure Evaluation (Addendum)
Anesthesia Evaluation  Patient identified by MRN, date of birth, ID band Patient awake    Reviewed: Allergy & Precautions, H&P , NPO status , Patient's Chart, lab work & pertinent test results  Airway Mallampati: II  TM Distance: >3 FB Neck ROM: full    Dental  (+) Dental Advisory Given, Implants Implant left upper front:   Pulmonary neg pulmonary ROS,    Pulmonary exam normal breath sounds clear to auscultation       Cardiovascular Exercise Tolerance: Good hypertension, Pt. on medications Normal cardiovascular exam Rhythm:regular Rate:Normal  AT CP normal stress   Neuro/Psych negative neurological ROS  negative psych ROS   GI/Hepatic negative GI ROS, Neg liver ROS, GERD  ,  Endo/Other  negative endocrine ROS  Renal/GU negative Renal ROS  negative genitourinary   Musculoskeletal   Abdominal   Peds  Hematology negative hematology ROS (+)   Anesthesia Other Findings   Reproductive/Obstetrics negative OB ROS                           Anesthesia Physical Anesthesia Plan  ASA: II  Anesthesia Plan: MAC   Post-op Pain Management:    Induction:   Airway Management Planned: Simple Face Mask  Additional Equipment:   Intra-op Plan:   Post-operative Plan:   Informed Consent: I have reviewed the patients History and Physical, chart, labs and discussed the procedure including the risks, benefits and alternatives for the proposed anesthesia with the patient or authorized representative who has indicated his/her understanding and acceptance.   Dental Advisory Given  Plan Discussed with: CRNA and Surgeon  Anesthesia Plan Comments:        Anesthesia Quick Evaluation

## 2015-01-12 NOTE — Transfer of Care (Signed)
Immediate Anesthesia Transfer of Care Note  Patient: Shelley Gutierrez  Procedure(s) Performed: Procedure(s): COLONOSCOPY WITH PROPOFOL (N/A)  Patient Location: PACU and Endoscopy Unit  Anesthesia Type:MAC  Level of Consciousness: awake, alert , oriented and patient cooperative  Airway & Oxygen Therapy: Patient Spontanous Breathing and Patient connected to face mask oxygen  Post-op Assessment: Report given to RN, Post -op Vital signs reviewed and stable and Patient moving all extremities  Post vital signs: Reviewed and stable  Last Vitals:  Filed Vitals:   01/12/15 0800  BP:   Pulse:   Temp: 70.9 C    Complications: No apparent anesthesia complications

## 2015-01-12 NOTE — Anesthesia Postprocedure Evaluation (Signed)
  Anesthesia Post-op Note  Patient: Shelley Gutierrez  Procedure(s) Performed: Procedure(s) (LRB): COLONOSCOPY WITH PROPOFOL (N/A)  Patient Location: PACU  Anesthesia Type: MAC  Level of Consciousness: awake and alert   Airway and Oxygen Therapy: Patient Spontanous Breathing  Post-op Pain: mild  Post-op Assessment: Post-op Vital signs reviewed, Patient's Cardiovascular Status Stable, Respiratory Function Stable, Patent Airway and No signs of Nausea or vomiting  Last Vitals:  Filed Vitals:   01/12/15 0900  BP: 147/77  Pulse: 73  Temp:   Resp: 20    Post-op Vital Signs: stable   Complications: No apparent anesthesia complications

## 2015-01-13 ENCOUNTER — Encounter (HOSPITAL_COMMUNITY): Payer: Self-pay | Admitting: Gastroenterology

## 2015-05-06 ENCOUNTER — Other Ambulatory Visit: Payer: Self-pay

## 2015-05-06 DIAGNOSIS — Z1231 Encounter for screening mammogram for malignant neoplasm of breast: Secondary | ICD-10-CM

## 2015-05-11 ENCOUNTER — Encounter: Payer: Self-pay | Admitting: Internal Medicine

## 2015-05-11 ENCOUNTER — Ambulatory Visit (INDEPENDENT_AMBULATORY_CARE_PROVIDER_SITE_OTHER): Payer: Medicare Other | Admitting: Internal Medicine

## 2015-05-11 VITALS — BP 126/84 | HR 74 | Ht 65.0 in | Wt 151.0 lb

## 2015-05-11 DIAGNOSIS — R002 Palpitations: Secondary | ICD-10-CM | POA: Diagnosis not present

## 2015-05-11 DIAGNOSIS — E785 Hyperlipidemia, unspecified: Secondary | ICD-10-CM | POA: Diagnosis not present

## 2015-05-11 DIAGNOSIS — I10 Essential (primary) hypertension: Secondary | ICD-10-CM

## 2015-05-11 NOTE — Patient Instructions (Signed)
Medication Instructions:  Your physician recommends that you continue on your current medications as directed. Please refer to the Current Medication list given to you today.   Labwork: none  Testing/Procedures: Your physician has recommended that you wear an event monitor. Event monitors are medical devices that record the heart's electrical activity. Doctors most often [place] these monitors to diagnose arrhythmias. Arrhythmias are problems with the speed or rhythm of the heartbeat. The monitor is a small, portable device. You can wear one while you do your normal daily activities. This is usually used to diagnose what is causing palpitations/syncope (passing out). 30 days   **Call our office when you are ready to setup Nurse Appt to have monitor placed**  Follow-Up: Follow up with Dr. Debara Pickett as needed.   Any Other Special Instructions Will Be Listed Below (If Applicable). Palpitations A palpitation is the feeling that your heartbeat is irregular or is faster than normal. It may feel like your heart is fluttering or skipping a beat. Palpitations are usually not a serious problem. However, in some cases, you may need further medical evaluation. CAUSES  Palpitations can be caused by:  Smoking.  Caffeine or other stimulants, such as diet pills or energy drinks.  Alcohol.  Stress and anxiety.  Strenuous physical activity.  Fatigue.  Certain medicines.  Heart disease, especially if you have a history of irregular heart rhythms (arrhythmias), such as atrial fibrillation, atrial flutter, or supraventricular tachycardia.  An improperly working pacemaker or defibrillator. DIAGNOSIS  To find the cause of your palpitations, your health care provider will take your medical history and perform a physical exam. Your health care provider may also have you take a test called an ambulatory electrocardiogram (ECG). An ECG records your heartbeat patterns over a 24-hour period. You may also have  other tests, such as:  Transthoracic echocardiogram (TTE). During echocardiography, sound waves are used to evaluate how blood flows through your heart.  Transesophageal echocardiogram (TEE).  Cardiac monitoring. This allows your health care provider to monitor your heart rate and rhythm in real time.  Holter monitor. This is a portable device that records your heartbeat and can help diagnose heart arrhythmias. It allows your health care provider to track your heart activity for several days, if needed.  Stress tests by exercise or by giving medicine that makes the heart beat faster. TREATMENT  Treatment of palpitations depends on the cause of your symptoms and can vary greatly. Most cases of palpitations do not require any treatment other than time, relaxation, and monitoring your symptoms. Other causes, such as atrial fibrillation, atrial flutter, or supraventricular tachycardia, usually require further treatment. HOME CARE INSTRUCTIONS   Avoid:  Caffeinated coffee, tea, soft drinks, diet pills, and energy drinks.  Chocolate.  Alcohol.  Stop smoking if you smoke.  Reduce your stress and anxiety. Things that can help you relax include:  A method of controlling things in your body, such as your heartbeats, with your mind (biofeedback).  Yoga.  Meditation.  Physical activity such as swimming, jogging, or walking.  Get plenty of rest and sleep. SEEK MEDICAL CARE IF:   You continue to have a fast or irregular heartbeat beyond 24 hours.  Your palpitations occur more often. SEEK IMMEDIATE MEDICAL CARE IF:  You have chest pain or shortness of breath.  You have a severe headache.  You feel dizzy or you faint. MAKE SURE YOU:  Understand these instructions.  Will watch your condition.  Will get help right away if you are not  doing well or get worse.   This information is not intended to replace advice given to you by your health care provider. Make sure you discuss any  questions you have with your health care provider.   Document Released: 04/14/2000 Document Revised: 04/22/2013 Document Reviewed: 06/16/2011 Elsevier Interactive Patient Education [employer].      If you need a refill on your cardiac medications before your next appointment, please call your pharmacy.

## 2015-05-11 NOTE — Progress Notes (Signed)
OFFICE NOTE  Chief Complaint:  Re-establish care, palpitations  Primary Care Physician: Helane Rima, MD  HPI:  Shelley Gutierrez is a pleasant 70 year old female that I first saw in 2012, but has not followed up with me in over 4 years. She was referred to me but Marcene Brawn for evaluation of an abnormal EKG. At that time she had Q waves in V1 and V2 that was read as septal infarct. She was having some chest discomfort and was sent for evaluation of coronary disease. Her past medical history significant for hypertension and dyslipidemia as well as breast cancer status post right mastectomy. She underwent a nuclear stress test at that time which was negative for ischemia. She did exercise on the treadmill and achieved 7 METS of exercise and the study was interpreted as low risk. Since then she's had no further chest pain, however recently she's had the development of some palpitations. She has been under some stress and recently had some cold and URI symptoms for which she's been taking over-the-counter NyQuil with a decongestant. She also drinks some caffeine on a daily basis. She reports her palpitations are mostly worse at night and generally resolved by the morning. She denies any chest pain or shortness of breath. EKG today shows sinus rhythm.  PMHx:  Past Medical History  Diagnosis Date  . Cyclical neutropenia ([place]) 03/03/2011  . Hypertension   . Hypercholesteremia   . Dyslipidemia   . Atypical chest pain 02/21/2011    Nuc, EF-83, Normal stress test  . Pulsatile tinnitus 03/08/2012    Carotid doppler, mild amount of fibrous plaque, no evidence of significant diameter reduction  . GERD (gastroesophageal reflux disease)   . Arthritis     Bursitis right shoulder  . Breast cancer ([place]) 03/03/2011    Right mastectomy- '96-surgery only for breast cancer    Past Surgical History  Procedure Laterality Date  . Mastectomy  1996    Right Side  . Breast reconstruction  1996    Right  Side  . Breast reconstruction  2010    Right Side  . Cataract extraction, bilateral Bilateral   . Colonoscopy with propofol N/A 01/12/2015    Procedure: COLONOSCOPY WITH PROPOFOL;  Surgeon: Garlan Fair, MD;  Location: [facility] END[facility]COPY;  Service: Endoscopy;  Laterality: N/A;    FAMHx:  Family History  Problem Relation Age of Onset  . Cancer Mother     Breast  . Cancer Father     Throat  . Cancer Maternal Grandmother     Pancreas  . Heart Problems Maternal Grandfather     Poor Circulation  . Graves' disease Sister   . Cancer Brother   . Cancer Sister     SOCHx:   reports that she has never smoked. She has never used smokeless tobacco. She reports that she does not drink alcohol or use illicit drugs.  ALLERGIES:  Allergies  Allergen Reactions  . Penicillins Other (See Comments)    States it made her body smell when she took it Pt does not tolerate smell    ROS: A comprehensive review of systems was negative except for: Ears, nose, mouth, throat, and face: positive for nasal congestion Cardiovascular: positive for palpitations  HOME MEDS: Current Outpatient Prescriptions  Medication Sig Dispense Refill  . acetaminophen (TYLENOL) 650 MG CR tablet Take 650 mg by mouth every 8 (eight) hours as needed for pain.    Marland Kitchen aspirin EC 81 MG tablet Take 81 mg by mouth  daily.    . calcium-vitamin D (OSCAL WITH D) 500-200 MG-UNIT per tablet Take 1 tablet by mouth daily.    . Ibuprofen (ADVIL PO) Take 2 capsules by mouth 2 (two) times daily as needed.    . loratadine (CLARITIN) 10 MG tablet Take 10 mg by mouth as needed for allergies.     Marland Kitchen meclizine (ANTIVERT) 50 MG tablet Take 1 tablet (50 mg total) by mouth 3 (three) times daily as needed. (Patient taking differently: Take 50 mg by mouth 3 (three) times daily as needed for dizziness. ) 30 tablet 0  . Multiple Vitamin (MULTIVITAMIN WITH MINERALS) TABS Take 1 tablet by mouth daily.    . pravastatin (PRAVACHOL) 40 MG tablet Take 40 mg by  mouth daily.    . valsartan (DIOVAN) 40 MG tablet Take 40 mg by mouth daily.    . vitamin C (ASCORBIC ACID) 500 MG tablet Take 500 mg by mouth daily.     No current facility-administered medications for this visit.    LABS/IMAGING: No results found for this or any previous visit (from the past 48 hour(s)). No results found.  WEIGHTS: Wt Readings from Last 3 Encounters:  05/11/15 151 lb (68.493 kg)  01/12/15 143 lb (64.864 kg)  09/17/14 146 lb 11.2 oz (66.543 kg)    VITALS: BP 126/84 mmHg  Pulse 74  Ht 5\' 5"  (1.651 m)  Wt 151 lb (68.493 kg)  BMI 25.13 kg/m2  EXAM: General appearance: alert and no distress Neck: no carotid bruit and no JVD Lungs: clear to auscultation bilaterally Heart: regular rate and rhythm, S1, S2 normal, no murmur, click, rub or gallop Abdomen: soft, non-tender; bowel sounds normal; no masses,  no organomegaly Extremities: extremities normal, atraumatic, no cyanosis or edema Pulses: 2+ and symmetric Skin: Skin color, texture, turgor normal. No rashes or lesions Neurologic: Grossly normal Psych: Plus  EKG: Normal sinus rhythm at 74  ASSESSMENT: 1. Palpitations 2. Hypertension-controlled 3. Dyslipidemia-at goal 4. History of breast cancer status post right mastectomy  PLAN: 1.   Shelley Gutierrez is describing some palpitations which are worse at night. They may be related to a cough syrup with decongestant that she takes in the evening. She also does drink a significant amount of caffeine. I've asked her to remove some of this from her diet. If her palpitations persist, we could consider placing a 30 day monitor. She said she had about 6 episodes over 30 day period. She is also traveling back in 4 to [place] is a relative of hers died and she's been under significant stress. She will not be back to [place] for 3 weeks. I've also indicated that she may want to have her thyroid tested at her primary care provider's office. Just to rule out hyper or  hypothyroidism as a possible cause of her palpitations. She is to contact our office if she wants to proceed with a 30 day monitor. I'm happy to see her back aced on those results.  Thank you for allowing me to participate in her care.  Pixie Casino, MD, [facility] Attending Cardiologist Lincoln C Odena Mcquaid 05/11/2015, 6:18 PM

## 2015-06-10 ENCOUNTER — Ambulatory Visit
Admission: RE | Admit: 2015-06-10 | Discharge: 2015-06-10 | Disposition: A | Discharge status: Home / Self Care | Payer: Medicare Other | Source: Ambulatory Visit

## 2015-06-10 DIAGNOSIS — Z1231 Encounter for screening mammogram for malignant neoplasm of breast: Secondary | ICD-10-CM

## 2015-06-17 ENCOUNTER — Telehealth: Payer: Self-pay | Admitting: Internal Medicine

## 2015-06-17 ENCOUNTER — Ambulatory Visit (INDEPENDENT_AMBULATORY_CARE_PROVIDER_SITE_OTHER): Payer: Medicare Other

## 2015-06-17 DIAGNOSIS — R002 Palpitations: Secondary | ICD-10-CM

## 2015-06-17 NOTE — Telephone Encounter (Signed)
SPOKE TO RIANNA INFORMED PATIENT DR HILTY WANTED HER TO DISCUSS WITH PRIMARY DOCTOR WHETHER OR NOT SHE NEEDS TO LABS FOR THYROID. PATIENT VOICE UNDERSTANDING.

## 2015-06-17 NOTE — Progress Notes (Unsigned)
1.) Reason for visit 30 day monitor replacement  2.) Name of MD requesting visit:   3.) H&P  4.) ROS related to problem  5.) Assessment and plan per MD - monitor placed on patient, instruction given . Patient verbalized understanding

## 2015-06-30 ENCOUNTER — Other Ambulatory Visit (HOSPITAL_COMMUNITY)
Admission: RE | Admit: 2015-06-30 | Discharge: 2015-06-30 | Disposition: A | Discharge status: Home / Self Care | Payer: Medicare Other | Source: Ambulatory Visit | Attending: Obstetrics and Gynecology | Admitting: Obstetrics and Gynecology

## 2015-06-30 ENCOUNTER — Other Ambulatory Visit: Payer: Self-pay | Admitting: Obstetrics and Gynecology

## 2015-06-30 DIAGNOSIS — Z1151 Encounter for screening for human papillomavirus (HPV): Secondary | ICD-10-CM | POA: Diagnosis present

## 2015-06-30 DIAGNOSIS — Z01419 Encounter for gynecological examination (general) (routine) without abnormal findings: Secondary | ICD-10-CM | POA: Insufficient documentation

## 2015-07-01 LAB — CYTOLOGY - PAP

## 2015-07-13 ENCOUNTER — Telehealth: Payer: Self-pay | Admitting: Internal Medicine

## 2015-07-13 NOTE — Telephone Encounter (Signed)
New message      Pt is wearing a monitor.  She got a call from the company stating that they have not been getting good transmission.  [place] and want to know if we have been getting any readings.  Can she stop wearing the monitor

## 2015-07-13 NOTE — Telephone Encounter (Signed)
Returned call to patient.  She is wearing a monitor and [employer] has run an audit on it and determined there may be a technical fault w/ the monitor. They are planning to send a replacement monitor out, as they have not been getting adequate readings. Pt wanted to check to see if we have been getting any information from the monitor. Informed her no weekly summary or alert readings on file. We determined she would need to re-wear when a new monitor is sent out. Gave apologies for the technical issues. Pt was understanding. Ultimately she decided that she will wear a 2nd monitor from [employer]; it is her understanding that they would work out any billing/insurance issues due to faulty equipment.  Pt will be set up on new monitor in about 10 days.  She currently does not have a f/u appt scheduled, so no need to reschedule for monitor f/u yet at this time.  Pt aware to call for further needs.

## 2015-07-22 NOTE — Progress Notes (Signed)
Yes please

## 2015-08-03 ENCOUNTER — Ambulatory Visit (INDEPENDENT_AMBULATORY_CARE_PROVIDER_SITE_OTHER): Payer: Medicare Other | Admitting: Internal Medicine

## 2015-08-03 ENCOUNTER — Encounter: Payer: Self-pay | Admitting: Internal Medicine

## 2015-08-03 VITALS — BP 144/80 | HR 74 | Ht 65.0 in | Wt 150.0 lb

## 2015-08-03 DIAGNOSIS — R002 Palpitations: Secondary | ICD-10-CM | POA: Diagnosis not present

## 2015-08-03 DIAGNOSIS — I10 Essential (primary) hypertension: Secondary | ICD-10-CM | POA: Diagnosis not present

## 2015-08-03 DIAGNOSIS — E785 Hyperlipidemia, unspecified: Secondary | ICD-10-CM | POA: Diagnosis not present

## 2015-08-03 DIAGNOSIS — I4891 Unspecified atrial fibrillation: Secondary | ICD-10-CM

## 2015-08-03 MED ORDER — METOPROLOL SUCCINATE ER 25 MG PO TB24: 12.5000 mg | ORAL_TABLET | Freq: Every day | ORAL | Status: DC

## 2015-08-03 MED ORDER — RIVAROXABAN 20 MG PO TABS: 20.0000 mg | ORAL_TABLET | Freq: Every day | ORAL | Status: DC

## 2015-08-03 NOTE — Progress Notes (Signed)
OFFICE NOTE  Chief Complaint:  Follow-up palpitations  Primary Care Physician: Helane Rima, MD  HPI:  Shelley Gutierrez is a pleasant 70 year old female that I first saw in 2012, but has not followed up with me in over 4 years. She was referred to me but Marcene Brawn for evaluation of an abnormal EKG. At that time she had Q waves in V1 and V2 that was read as septal infarct. She was having some chest discomfort and was sent for evaluation of coronary disease. Her past medical history significant for hypertension and dyslipidemia as well as breast cancer status post right mastectomy. She underwent a nuclear stress test at that time which was negative for ischemia. She did exercise on the treadmill and achieved 7 METS of exercise and the study was interpreted as low risk. Since then she's had no further chest pain, however recently she's had the development of some palpitations. She has been under some stress and recently had some cold and URI symptoms for which she's been taking over-the-counter NyQuil with a decongestant. She also drinks some caffeine on a daily basis. She reports her palpitations are mostly worse at night and generally resolved by the morning. She denies any chest pain or shortness of breath. EKG today shows sinus rhythm.  Shelley Gutierrez returns today for follow-up of her palpitations. She underwent monitoring but had some difficulty with the monitor. This was sent back to the company and she was re-fitted with a monitor. I did review her monitor results which showed episodes of paroxysmal atrial fibrillation which were fairly short-lived. Is not clear that she was symptomatic with these episodes. This patients CHA2DS2-VASc Score and unadjusted Ischemic Stroke Rate (% per year) is equal to 3.2 % stroke rate/year from a score of 3 (Above score calculated as 1 point each if present [CHF, HTN, DM, Vascular=MI/PAD/Aortic Plaque, Age if 65-74, or Female] Above score calculated as 2 points  each if present [Age > 75, or Stroke/TIA/TE]. I discussed the findings with her today and she had numerous questions for me. We spent greater than 30 minutes discussing the risks and benefits of anticoagulation, how anticoagulants work, why I believe she needs to be on something for rate control because of the possibility of recurrent A. fib and the fact that it may be associated with symptoms or significant tachycardia, etc. In addition, I asked Tommy Medal, PharmD, our anticoagulation pharmacist to talk with her about the risks and benefits of long-term anticoagulation. I propose today that she goes on to Xarelto 20 mg daily and discontinue aspirin. In addition I would recommend adding low-dose Toprol-XL 12.5 mg daily.  PMHx:  Past Medical History  Diagnosis Date  . Cyclical neutropenia ([place]) 03/03/2011  . Hypertension   . Hypercholesteremia   . Dyslipidemia   . Atypical chest pain 02/21/2011    Nuc, EF-83, Normal stress test  . Pulsatile tinnitus 03/08/2012    Carotid doppler, mild amount of fibrous plaque, no evidence of significant diameter reduction  . GERD (gastroesophageal reflux disease)   . Arthritis     Bursitis right shoulder  . Breast cancer ([place]) 03/03/2011    Right mastectomy- '96-surgery only for breast cancer    Past Surgical History  Procedure Laterality Date  . Mastectomy  1996    Right Side  . Breast reconstruction  1996    Right Side  . Breast reconstruction  2010    Right Side  . Cataract extraction, bilateral Bilateral   . Colonoscopy with  propofol N/A 01/12/2015    Procedure: COLONOSCOPY WITH PROPOFOL;  Surgeon: Garlan Fair, MD;  Location: [facility];  Service: Endoscopy;  Laterality: N/A;    FAMHx:  Family History  Problem Relation Age of Onset  . Cancer Mother     Breast  . Cancer Father     Throat  . Cancer Maternal Grandmother     Pancreas  . Heart Problems Maternal Grandfather     Poor Circulation  . Graves' disease Sister   . Cancer  Brother   . Cancer Sister     SOCHx:   reports that she has never smoked. She has never used smokeless tobacco. She reports that she does not drink alcohol or use illicit drugs.  ALLERGIES:  Allergies  Allergen Reactions  . Penicillins Other (See Comments)    States it made her body smell when she took it Pt does not tolerate smell    ROS: Pertinent items noted in HPI and remainder of comprehensive ROS otherwise negative.  HOME MEDS: Current Outpatient Prescriptions  Medication Sig Dispense Refill  . acetaminophen (TYLENOL) 650 MG CR tablet Take 650 mg by mouth every 8 (eight) hours as needed for pain.    Marland Kitchen aspirin EC 81 MG tablet Take 81 mg by mouth daily.    . Calcium Citrate-Vitamin D (CALCIUM CITRATE + PO) Take 1 tablet by mouth at bedtime.    . calcium-vitamin D (OSCAL WITH D) 500-200 MG-UNIT per tablet Take 1 tablet by mouth daily.    . Ibuprofen (ADVIL PO) Take 2 capsules by mouth 2 (two) times daily as needed.    . loratadine (CLARITIN) 10 MG tablet Take 10 mg by mouth as needed for allergies.     Marland Kitchen meclizine (ANTIVERT) 50 MG tablet Take 1 tablet (50 mg total) by mouth 3 (three) times daily as needed. (Patient taking differently: Take 50 mg by mouth 3 (three) times daily as needed for dizziness. ) 30 tablet 0  . Multiple Vitamin (MULTIVITAMIN WITH MINERALS) TABS Take 1 tablet by mouth daily.    . pravastatin (PRAVACHOL) 40 MG tablet Take 40 mg by mouth daily.    . valsartan (DIOVAN) 40 MG tablet Take 40 mg by mouth daily.    . vitamin C (ASCORBIC ACID) 500 MG tablet Take 500 mg by mouth daily.    . metoprolol succinate (TOPROL-XL) 25 MG 24 hr tablet Take 0.5 tablets (12.5 mg total) by mouth daily. 45 tablet 3  . rivaroxaban (XARELTO) 20 MG TABS tablet Take 1 tablet (20 mg total) by mouth daily with supper. 30 tablet 5   No current facility-administered medications for this visit.    LABS/IMAGING: No results found for this or any previous visit (from the past 48  hour(s)). No results found.  WEIGHTS: Wt Readings from Last 3 Encounters:  08/03/15 150 lb (68.04 kg)  05/11/15 151 lb (68.493 kg)  01/12/15 143 lb (64.864 kg)    VITALS: BP 144/80 mmHg  Pulse 74  Ht 5\' 5"  (1.651 m)  Wt 150 lb (68.04 kg)  BMI 24.96 kg/m2  EXAM: Deferred  EKG: Deferred  ASSESSMENT: 1. Palpitations - PAF noted on the monitor, asymptomatic 2. Hypertension-controlled 3. Dyslipidemia-at goal 4. History of breast cancer status post right mastectomy  PLAN: 1.   Shelley Gutierrez has been having some palpitations however she says they've gotten better. We placed her on a monitor which showed PAF however there was no symptomatic correlation with those episodes. Based on her CHADSVASC score 3,  I'm recommending additional anticoagulation and she preferred once daily dosing. We'll start her on Xarelto 20 mg daily. She can discontinue aspirin. In addition I recommend starting low-dose Toprol-XL 12.5 mg daily. That should help with blood pressure as well as suppress some of her ectopic beats and perhaps her A. fib to keep it from being fast.  Follow-up with me in 3 months.  Pixie Casino, MD, [facility] Attending Cardiologist [place] 08/03/2015, 1:36 PM

## 2015-08-03 NOTE — Patient Instructions (Addendum)
Your physician has recommended you make the following change in your medication..  1. START xarelto 20mg  once daily 2. START metoprolol succinate 12.5mg  once daily  Your physician recommends that you schedule a follow-up appointment in [place] with Dr. Debara Pickett  Samples - 2 bottles & co-pay card provided to patient

## 2015-10-04 ENCOUNTER — Other Ambulatory Visit: Payer: Self-pay

## 2015-10-04 DIAGNOSIS — C50911 Malignant neoplasm of unspecified site of right female breast: Secondary | ICD-10-CM

## 2015-10-05 ENCOUNTER — Encounter: Payer: Self-pay | Admitting: Hematology and Oncology

## 2015-10-05 ENCOUNTER — Other Ambulatory Visit (HOSPITAL_BASED_OUTPATIENT_CLINIC_OR_DEPARTMENT_OTHER): Payer: Medicare Other

## 2015-10-05 ENCOUNTER — Telehealth: Payer: Self-pay | Admitting: Hematology and Oncology

## 2015-10-05 ENCOUNTER — Ambulatory Visit (HOSPITAL_BASED_OUTPATIENT_CLINIC_OR_DEPARTMENT_OTHER): Payer: Medicare Other | Admitting: Hematology and Oncology

## 2015-10-05 VITALS — BP 142/83 | HR 72 | Temp 98.8°F | Resp 18 | Ht 65.0 in | Wt 149.2 lb

## 2015-10-05 DIAGNOSIS — C50911 Malignant neoplasm of unspecified site of right female breast: Secondary | ICD-10-CM

## 2015-10-05 DIAGNOSIS — I4891 Unspecified atrial fibrillation: Secondary | ICD-10-CM | POA: Diagnosis not present

## 2015-10-05 DIAGNOSIS — Z853 Personal history of malignant neoplasm of breast: Secondary | ICD-10-CM

## 2015-10-05 DIAGNOSIS — D704 Cyclic neutropenia: Secondary | ICD-10-CM | POA: Diagnosis not present

## 2015-10-05 DIAGNOSIS — C50411 Malignant neoplasm of upper-outer quadrant of right female breast: Secondary | ICD-10-CM

## 2015-10-05 LAB — COMPREHENSIVE METABOLIC PANEL
ALBUMIN: 4 g/dL (ref 3.5–5.0)
ALK PHOS: 63 U/L (ref 40–150)
ALT: 16 U/L (ref 0–55)
AST: 15 U/L (ref 5–34)
Anion Gap: 6 mEq/L (ref 3–11)
BUN: 11.3 mg/dL (ref 7.0–26.0)
CO2: 27 meq/L (ref 22–29)
Calcium: 9.9 mg/dL (ref 8.4–10.4)
Chloride: 107 mEq/L (ref 98–109)
Creatinine: 0.9 mg/dL (ref 0.6–1.1)
EGFR: 79 mL/min/{1.73_m2} — AB (ref >90)
GLUCOSE: 103 mg/dL (ref 70–140)
POTASSIUM: 4.4 meq/L (ref 3.5–5.1)
SODIUM: 140 meq/L (ref 136–145)
Total Bilirubin: 0.42 mg/dL (ref 0.20–1.20)
Total Protein: 7.2 g/dL (ref 6.4–8.3)

## 2015-10-05 LAB — CBC WITH DIFFERENTIAL/PLATELET
BASO%: 1 % (ref 0.0–2.0)
BASOS ABS: 0 10*3/uL (ref 0.0–0.1)
EOS ABS: 0 10*3/uL (ref 0.0–0.5)
EOS%: 1.4 % (ref 0.0–7.0)
HCT: 43.3 % (ref 34.8–46.6)
HGB: 14 g/dL (ref 11.6–15.9)
LYMPH%: 39 % (ref 14.0–49.7)
MCH: 29.2 pg (ref 25.1–34.0)
MCHC: 32.3 g/dL (ref 31.5–36.0)
MCV: 90.4 fL (ref 79.5–101.0)
MONO#: 0.3 10*3/uL (ref 0.1–0.9)
MONO%: 10.4 % (ref 0.0–14.0)
NEUT#: 1.5 10*3/uL (ref 1.5–6.5)
NEUT%: 48.2 % (ref 38.4–76.8)
Platelets: 177 10*3/uL (ref 145–400)
RBC: 4.79 10*6/uL (ref 3.70–5.45)
RDW: 13.3 % (ref 11.2–14.5)
WBC: 3.1 10*3/uL — ABNORMAL LOW (ref 3.9–10.3)
lymph#: 1.2 10*3/uL (ref 0.9–3.3)

## 2015-10-05 NOTE — Assessment & Plan Note (Signed)
Right breast cancer diagnosed in 1996 status post right mastectomy with axillary lymph node dissection with immediate TRAM reconstruction (I do not have any proper records of pathological staging or receptor status)  Breast Cancer Surveillance: 1. Breast exam 10/05/2015: Normal 2. Mammogram 06/11/2015 hour away with the right No abnormalities. Postsurgical changes. Breast Density Category B. I recommended that she get 3-D mammograms for surveillance. Discussed the differences between different breast density categories.  Cyclical neutropenia: Under observation. 1 year follow-up on this with labs, WBC count 3.4 with an [place] of 1600 significant change from last year  Return to clinic in 1 year for follow-up with survivorship nurse practitioner

## 2015-10-05 NOTE — Telephone Encounter (Signed)
appt made and avs printed °

## 2015-10-05 NOTE — Progress Notes (Signed)
Patient Care Team: Helane Rima, MD as PCP - General (Family Medicine)  DIAGNOSIS: Breast cancer right breast diagnosed in 1996, mild leukopenia  CHIEF COMPLIANT: Follow-up of right breast cancer  INTERVAL HISTORY: Shelley Gutierrez is a 70 year old with above-mentioned history of right-sided breast cancer currently on surveillance. She reports no problems or concerns. She was diagnosed with atrial fibrillation and is now on blood thinners and Toprol. Mammograms done in February were normal.  REVIEW OF SYSTEMS:   Constitutional: Denies fevers, chills or abnormal weight loss Eyes: Denies blurriness of vision Ears, nose, mouth, throat, and face: Denies mucositis or sore throat Respiratory: Denies cough, dyspnea or wheezes Cardiovascular: Atrial fibrillation Gastrointestinal:  Denies nausea, heartburn or change in bowel habits Skin: Denies abnormal skin rashes Lymphatics: Denies new lymphadenopathy or easy bruising Neurological:Denies numbness, tingling or new weaknesses Behavioral/Psych: Mood is stable, no new changes  Extremities: No lower extremity edema Breast:  denies any pain or lumps or nodules in either breasts All other systems were reviewed with the patient and are negative.  I have reviewed the past medical history, past surgical history, social history and family history with the patient and they are unchanged from previous note.  ALLERGIES:  is allergic to penicillins.  MEDICATIONS:  Current Outpatient Prescriptions  Medication Sig Dispense Refill  . acetaminophen (TYLENOL) 650 MG CR tablet Take 650 mg by mouth every 8 (eight) hours as needed for pain.    Marland Kitchen aspirin EC 81 MG tablet Take 81 mg by mouth daily.    . Calcium Citrate-Vitamin D (CALCIUM CITRATE + PO) Take 1 tablet by mouth at bedtime.    . calcium-vitamin D (OSCAL WITH D) 500-200 MG-UNIT per tablet Take 1 tablet by mouth daily.    Marland Kitchen loratadine (CLARITIN) 10 MG tablet Take 10 mg by mouth as needed for  allergies.     . metoprolol succinate (TOPROL-XL) 25 MG 24 hr tablet Take 0.5 tablets (12.5 mg total) by mouth daily. 45 tablet 3  . Multiple Vitamin (MULTIVITAMIN WITH MINERALS) TABS Take 1 tablet by mouth daily.    . pravastatin (PRAVACHOL) 40 MG tablet Take 40 mg by mouth daily.    . rivaroxaban (XARELTO) 20 MG TABS tablet Take 1 tablet (20 mg total) by mouth daily with supper. 30 tablet 5  . valsartan (DIOVAN) 40 MG tablet Take 40 mg by mouth daily.    . vitamin C (ASCORBIC ACID) 500 MG tablet Take 500 mg by mouth daily.     No current facility-administered medications for this visit.    PHYSICAL EXAMINATION: ECOG PERFORMANCE STATUS: 0 - Asymptomatic  Filed Vitals:   10/05/15 1136  BP: 142/83  Pulse: 72  Temp: 98.8 F (37.1 C)  Resp: 18   Filed Weights   10/05/15 1136  Weight: 149 lb 3.2 oz (67.677 kg)    GENERAL:alert, no distress and comfortable SKIN: skin color, texture, turgor are normal, no rashes or significant lesions EYES: normal, Conjunctiva are pink and non-injected, sclera clear OROPHARYNX:no exudate, no erythema and lips, buccal mucosa, and tongue normal  NECK: supple, thyroid normal size, non-tender, without nodularity LYMPH:  no palpable lymphadenopathy in the cervical, axillary or inguinal LUNGS: clear to auscultation and percussion with normal breathing effort HEART: Atrial fibrillation ABDOMEN:abdomen soft, non-tender and normal bowel sounds MUSCULOSKELETAL:no cyanosis of digits and no clubbing  NEURO: alert & oriented x 3 with fluent speech, no focal motor/sensory deficits EXTREMITIES: No lower extremity edema BREAST: No palpable masses or nodules in either right or  left breasts. No palpable axillary supraclavicular or infraclavicular adenopathy no breast tenderness or nipple discharge. (exam performed in the presence of a chaperone)  LABORATORY DATA:  I have reviewed the data as listed   Chemistry      Component Value Date/Time   NA 140 10/05/2015  1127   NA 139 01/17/2012 1120   NA 140 09/17/2008 1058   K 4.4 10/05/2015 1127   K 3.7 01/17/2012 1120   K 4.3 09/17/2008 1058   CL 107 03/08/2012 1014   CL 101 01/17/2012 1120   CL 105 09/17/2008 1058   CO2 27 10/05/2015 1127   CO2 25 01/17/2012 1120   CO2 28 09/17/2008 1058   BUN 11.3 10/05/2015 1127   BUN 12 01/17/2012 1120   BUN 21 09/17/2008 1058   CREATININE 0.9 10/05/2015 1127   CREATININE 0.79 01/17/2012 1120   CREATININE 1.2 09/17/2008 1058      Component Value Date/Time   CALCIUM 9.9 10/05/2015 1127   CALCIUM 10.7* 01/17/2012 1120   CALCIUM 9.9 09/17/2008 1058   ALKPHOS 63 10/05/2015 1127   ALKPHOS 54 05/11/2011 1355   ALKPHOS 67 09/17/2008 1058   AST 15 10/05/2015 1127   AST 20 05/11/2011 1355   AST 27 09/17/2008 1058   ALT 16 10/05/2015 1127   ALT 18 05/11/2011 1355   ALT 30 09/17/2008 1058   BILITOT 0.42 10/05/2015 1127   BILITOT 0.5 05/11/2011 1355   BILITOT 0.40 09/17/2008 1058       Lab Results  Component Value Date   WBC 3.1* 10/05/2015   HGB 14.0 10/05/2015   HCT 43.3 10/05/2015   MCV 90.4 10/05/2015   PLT 177 10/05/2015   NEUTROABS 1.5 10/05/2015     ASSESSMENT & PLAN:  Breast cancer of upper-outer quadrant of right female breast ([place]) Right breast cancer diagnosed in 1996 status post right mastectomy with axillary lymph node dissection with immediate TRAM reconstruction (I do not have any proper records of pathological staging or receptor status)  Breast Cancer Surveillance: 1. Breast exam 10/05/2015: Normal 2. Mammogram 06/11/2015 hour away with the right No abnormalities. Postsurgical changes. Breast Density Category B. I recommended that she get 3-D mammograms for surveillance. Discussed the differences between different breast density categories.  Cyclical neutropenia: Under observation. 1 year follow-up on this with labs, WBC count 3.1 with an [place] of 1500. No significant change from last 9 years. Atrial fibrillation: Currently on  xarelto and Toprol-XL. Return to clinic in 1 year for follow-up with survivorship nurse practitioner   Orders Placed This Encounter  Procedures  . CBC with Differential    Standing Status: Future     Number of Occurrences:      Standing Expiration Date: 10/04/2016  . Amb Referral to Survivorship Long term    Referral Priority:  Routine    Referral Type:  Consultation    Number of Visits Requested:  1   The patient has a good understanding of the overall plan. she agrees with it. she will call with any problems that may develop before the next visit here.   Rulon Eisenmenger, MD 10/05/2015

## 2015-11-18 ENCOUNTER — Ambulatory Visit (INDEPENDENT_AMBULATORY_CARE_PROVIDER_SITE_OTHER): Payer: Medicare Other | Admitting: Internal Medicine

## 2015-11-18 ENCOUNTER — Encounter: Payer: Self-pay | Admitting: Internal Medicine

## 2015-11-18 VITALS — BP 138/84 | HR 83 | Ht 64.5 in | Wt 150.2 lb

## 2015-11-18 DIAGNOSIS — E785 Hyperlipidemia, unspecified: Secondary | ICD-10-CM | POA: Diagnosis not present

## 2015-11-18 DIAGNOSIS — I10 Essential (primary) hypertension: Secondary | ICD-10-CM

## 2015-11-18 DIAGNOSIS — I480 Paroxysmal atrial fibrillation: Secondary | ICD-10-CM | POA: Diagnosis not present

## 2015-11-18 NOTE — Progress Notes (Signed)
OFFICE NOTE  Chief Complaint:  No complaints  Primary Care Physician: Helane Rima, MD  HPI:  Shelley Gutierrez is a pleasant 70 year old female that I first saw in 2012, but has not followed up with me in over 4 years. She was referred to me but Marcene Brawn for evaluation of an abnormal EKG. At that time she had Q waves in V1 and V2 that was read as septal infarct. She was having some chest discomfort and was sent for evaluation of coronary disease. Her past medical history significant for hypertension and dyslipidemia as well as breast cancer status post right mastectomy. She underwent a nuclear stress test at that time which was negative for ischemia. She did exercise on the treadmill and achieved 7 METS of exercise and the study was interpreted as low risk. Since then she's had no further chest pain, however recently she's had the development of some palpitations. She has been under some stress and recently had some cold and URI symptoms for which she's been taking over-the-counter NyQuil with a decongestant. She also drinks some caffeine on a daily basis. She reports her palpitations are mostly worse at night and generally resolved by the morning. She denies any chest pain or shortness of breath. EKG today shows sinus rhythm.  Shelley Gutierrez returns today for follow-up of her palpitations. She underwent monitoring but had some difficulty with the monitor. This was sent back to the company and she was re-fitted with a monitor. I did review her monitor results which showed episodes of paroxysmal atrial fibrillation which were fairly short-lived. Is not clear that she was symptomatic with these episodes. This patients CHA2DS2-VASc Score and unadjusted Ischemic Stroke Rate (% per year) is equal to 3.2 % stroke rate/year from a score of 3 (Above score calculated as 1 point each if present [CHF, HTN, DM, Vascular=MI/PAD/Aortic Plaque, Age if 65-74, or Female] Above score calculated as 2 points each if  present [Age > 75, or Stroke/TIA/TE]. I discussed the findings with her today and she had numerous questions for me. We spent greater than 30 minutes discussing the risks and benefits of anticoagulation, how anticoagulants work, why I believe she needs to be on something for rate control because of the possibility of recurrent A. fib and the fact that it may be associated with symptoms or significant tachycardia, etc. In addition, I asked Tommy Medal, PharmD, our anticoagulation pharmacist to talk with her about the risks and benefits of long-term anticoagulation. I propose today that she goes on to Xarelto 20 mg daily and discontinue aspirin. In addition I would recommend adding low-dose Toprol-XL 12.5 mg daily.  11/18/2015  Shelley Gutierrez returns today for follow-up. She reports she's had no further palpitations. She seems to be tolerating Xarelto without any difficulty. In general her energy level is good. There is no adverse bleeding.  PMHx:  Past Medical History  Diagnosis Date  . Cyclical neutropenia ([place]) 03/03/2011  . Hypertension   . Hypercholesteremia   . Dyslipidemia   . Atypical chest pain 02/21/2011    Nuc, EF-83, Normal stress test  . Pulsatile tinnitus 03/08/2012    Carotid doppler, mild amount of fibrous plaque, no evidence of significant diameter reduction  . GERD (gastroesophageal reflux disease)   . Arthritis     Bursitis right shoulder  . Breast cancer ([place]) 03/03/2011    Right mastectomy- '96-surgery only for breast cancer    Past Surgical History  Procedure Laterality Date  . Mastectomy  1996  Right Side  . Breast reconstruction  1996    Right Side  . Breast reconstruction  2010    Right Side  . Cataract extraction, bilateral Bilateral   . Colonoscopy with propofol N/A 01/12/2015    Procedure: COLONOSCOPY WITH PROPOFOL;  Surgeon: Garlan Fair, MD;  Location: [facility];  Service: Endoscopy;  Laterality: N/A;    FAMHx:  Family History  Problem Relation  Age of Onset  . Cancer Mother     Breast  . Cancer Father     Throat  . Cancer Maternal Grandmother     Pancreas  . Heart Problems Maternal Grandfather     Poor Circulation  . Graves' disease Sister   . Cancer Brother   . Cancer Sister     SOCHx:   reports that she has never smoked. She has never used smokeless tobacco. She reports that she does not drink alcohol or use illicit drugs.  ALLERGIES:  Allergies  Allergen Reactions  . Penicillins Other (See Comments)    States it made her body smell when she took it Pt does not tolerate smell    ROS: Pertinent items noted in HPI and remainder of comprehensive ROS otherwise negative.  HOME MEDS: Current Outpatient Prescriptions  Medication Sig Dispense Refill  . Calcium Citrate-Vitamin D (CALCIUM CITRATE + PO) Take 1 tablet by mouth at bedtime.    . calcium-vitamin D (OSCAL WITH D) 500-200 MG-UNIT per tablet Take 1 tablet by mouth daily.    Marland Kitchen loratadine (CLARITIN) 10 MG tablet Take 10 mg by mouth as needed for allergies.     . metoprolol succinate (TOPROL-XL) 25 MG 24 hr tablet Take 0.5 tablets (12.5 mg total) by mouth daily. 45 tablet 3  . Multiple Vitamin (MULTIVITAMIN WITH MINERALS) TABS Take 1 tablet by mouth daily.    . pravastatin (PRAVACHOL) 40 MG tablet Take 40 mg by mouth daily.    . rivaroxaban (XARELTO) 20 MG TABS tablet Take 1 tablet (20 mg total) by mouth daily with supper. 30 tablet 5  . valsartan (DIOVAN) 40 MG tablet Take 40 mg by mouth daily.    . vitamin C (ASCORBIC ACID) 500 MG tablet Take 500 mg by mouth daily.     No current facility-administered medications for this visit.    LABS/IMAGING: No results found for this or any previous visit (from the past 48 hour(s)). No results found.  WEIGHTS: Wt Readings from Last 3 Encounters:  11/18/15 150 lb 3.2 oz (68.13 kg)  10/05/15 149 lb 3.2 oz (67.677 kg)  08/03/15 150 lb (68.04 kg)    VITALS: BP 138/84 mmHg  Pulse 83  Ht 5' 4.5" (1.638 m)  Wt 150 lb  3.2 oz (68.13 kg)  BMI 25.39 kg/m2  EXAM: General appearance: alert and no distress Neck: no carotid bruit and no JVD Lungs: clear to auscultation bilaterally Heart: regular rate and rhythm, S1, S2 normal, no murmur, click, rub or gallop Abdomen: soft, non-tender; bowel sounds normal; no masses,  no organomegaly Extremities: extremities normal, atraumatic, no cyanosis or edema Pulses: 2+ and symmetric Skin: Skin color, texture, turgor normal. No rashes or lesions Neurologic: Grossly normal  EKG: Normal sinus rhythm at 83  ASSESSMENT: 1. Palpitations - PAF noted on the monitor, asymptomatic (CHADSVASC score 3) - on Xarelto 2. Hypertension-controlled 3. Dyslipidemia-at goal 4. History of breast cancer status post right mastectomy  PLAN: 1.   Shelley Gutierrez is doing well on Xarelto for PAF. She remains mostly in sinus rhythm. Blood  pressure is at goal. Cholesterol is at goal. We'll continue her current medications and plan to follow-up with me in 6 months or sooner as necessary.  Pixie Casino, MD, [facility] Attending Cardiologist [place] 11/18/2015, 5:46 PM

## 2015-11-18 NOTE — Patient Instructions (Signed)
Your physician wants you to follow-up in: 6 months with Dr. Debara Pickett. You will receive a reminder letter in the mail two months in advance. If you don't receive a letter, please call our office to schedule the follow-up appointment.   If you need a refill on your cardiac medications before your next appointment, please call your pharmacy.

## 2015-11-29 ENCOUNTER — Other Ambulatory Visit: Payer: Self-pay | Admitting: Obstetrics and Gynecology

## 2015-11-29 DIAGNOSIS — N644 Mastodynia: Secondary | ICD-10-CM

## 2015-11-29 DIAGNOSIS — Z872 Personal history of diseases of the skin and subcutaneous tissue: Secondary | ICD-10-CM

## 2015-11-29 DIAGNOSIS — Z9011 Acquired absence of right breast and nipple: Secondary | ICD-10-CM

## 2015-12-07 ENCOUNTER — Ambulatory Visit
Admission: RE | Admit: 2015-12-07 | Discharge: 2015-12-07 | Disposition: A | Discharge status: Home / Self Care | Payer: Medicare Other | Source: Ambulatory Visit | Attending: Obstetrics and Gynecology | Admitting: Obstetrics and Gynecology

## 2015-12-07 DIAGNOSIS — Z9011 Acquired absence of right breast and nipple: Secondary | ICD-10-CM

## 2015-12-07 DIAGNOSIS — N644 Mastodynia: Secondary | ICD-10-CM

## 2015-12-07 DIAGNOSIS — Z872 Personal history of diseases of the skin and subcutaneous tissue: Secondary | ICD-10-CM

## 2016-01-16 ENCOUNTER — Other Ambulatory Visit: Payer: Self-pay | Admitting: Internal Medicine

## 2016-01-16 DIAGNOSIS — I4891 Unspecified atrial fibrillation: Secondary | ICD-10-CM

## 2016-07-10 ENCOUNTER — Other Ambulatory Visit: Payer: Self-pay | Admitting: Internal Medicine

## 2016-07-10 DIAGNOSIS — I4891 Unspecified atrial fibrillation: Secondary | ICD-10-CM

## 2016-07-15 ENCOUNTER — Other Ambulatory Visit: Payer: Self-pay | Admitting: Internal Medicine

## 2016-07-15 DIAGNOSIS — I4891 Unspecified atrial fibrillation: Secondary | ICD-10-CM

## 2016-07-18 ENCOUNTER — Ambulatory Visit (INDEPENDENT_AMBULATORY_CARE_PROVIDER_SITE_OTHER): Payer: Medicare Other | Admitting: Internal Medicine

## 2016-07-18 ENCOUNTER — Encounter: Payer: Self-pay | Admitting: Internal Medicine

## 2016-07-18 VITALS — BP 140/81 | HR 74 | Ht 65.0 in | Wt 146.0 lb

## 2016-07-18 DIAGNOSIS — I10 Essential (primary) hypertension: Secondary | ICD-10-CM | POA: Diagnosis not present

## 2016-07-18 DIAGNOSIS — E785 Hyperlipidemia, unspecified: Secondary | ICD-10-CM | POA: Diagnosis not present

## 2016-07-18 DIAGNOSIS — I4891 Unspecified atrial fibrillation: Secondary | ICD-10-CM | POA: Diagnosis not present

## 2016-07-18 NOTE — Patient Instructions (Signed)
Your physician recommends that you continue on your current medications as directed. Please refer to the Current Medication list given to you today.  Your physician recommends that you schedule a follow-up appointment in: 6 MONTHS WITH DR. HILTY  

## 2016-07-18 NOTE — Progress Notes (Signed)
OFFICE NOTE  Chief Complaint:  No complaints  Primary Care Physician: Chesley Noon, MD  HPI:  Shelley Gutierrez is a pleasant 71 year old female that I first saw in 2012, but has not followed up with me in over 4 years. She was referred to me but Marcene Brawn for evaluation of an abnormal EKG. At that time she had Q waves in V1 and V2 that was read as septal infarct. She was having some chest discomfort and was sent for evaluation of coronary disease. Her past medical history significant for hypertension and dyslipidemia as well as breast cancer status post right mastectomy. She underwent a nuclear stress test at that time which was negative for ischemia. She did exercise on the treadmill and achieved 7 METS of exercise and the study was interpreted as low risk. Since then she's had no further chest pain, however recently she's had the development of some palpitations. She has been under some stress and recently had some cold and URI symptoms for which she's been taking over-the-counter NyQuil with a decongestant. She also drinks some caffeine on a daily basis. She reports her palpitations are mostly worse at night and generally resolved by the morning. She denies any chest pain or shortness of breath. EKG today shows sinus rhythm.  Shelley Gutierrez returns today for follow-up of her palpitations. She underwent monitoring but had some difficulty with the monitor. This was sent back to the company and she was re-fitted with a monitor. I did review her monitor results which showed episodes of paroxysmal atrial fibrillation which were fairly short-lived. Is not clear that she was symptomatic with these episodes. This patients CHA2DS2-VASc Score and unadjusted Ischemic Stroke Rate (% per year) is equal to 3.2 % stroke rate/year from a score of 3 (Above score calculated as 1 point each if present [CHF, HTN, DM, Vascular=MI/PAD/Aortic Plaque, Age if 65-74, or Female] Above score calculated as 2 points each if  present [Age > 75, or Stroke/TIA/TE]. I discussed the findings with her today and she had numerous questions for me. We spent greater than 30 minutes discussing the risks and benefits of anticoagulation, how anticoagulants work, why I believe she needs to be on something for rate control because of the possibility of recurrent A. fib and the fact that it may be associated with symptoms or significant tachycardia, etc. In addition, I asked Tommy Medal, PharmD, our anticoagulation pharmacist to talk with her about the risks and benefits of long-term anticoagulation. I propose today that she goes on to Xarelto 20 mg daily and discontinue aspirin. In addition I would recommend adding low-dose Toprol-XL 12.5 mg daily.  11/18/2015  Shelley Gutierrez returns today for follow-up. She reports she's had no further palpitations. She seems to be tolerating Xarelto without any difficulty. In general her energy level is good. There is no adverse bleeding.  07/18/2016  Shelley Gutierrez was seen today in follow-up. She denies any palpitations or recurrent A. fib. She's not had any difficulty with Xarelto. Blood pressure at home is between 836 and 629 systolic although was elevated slightly today.  PMHx:  Past Medical History:  Diagnosis Date  . Arthritis    Bursitis right shoulder  . Atypical chest pain 02/21/2011   Nuc, EF-83, Normal stress test  . Breast cancer ([place]) 03/03/2011   Right mastectomy- '96-surgery only for breast cancer  . Cyclical neutropenia ([place]) 03/03/2011  . Dyslipidemia   . GERD (gastroesophageal reflux disease)   . Hypercholesteremia   . Hypertension   .  Pulsatile tinnitus 03/08/2012   Carotid doppler, mild amount of fibrous plaque, no evidence of significant diameter reduction    Past Surgical History:  Procedure Laterality Date  . BREAST RECONSTRUCTION  1996   Right Side  . BREAST RECONSTRUCTION  2010   Right Side  . CATARACT EXTRACTION, BILATERAL Bilateral   . COLONOSCOPY WITH  PROPOFOL N/A 01/12/2015   Procedure: COLONOSCOPY WITH PROPOFOL;  Surgeon: Garlan Fair, MD;  Location: [facility];  Service: Endoscopy;  Laterality: N/A;  . MASTECTOMY  1996   Right Side    FAMHx:  Family History  Problem Relation Age of Onset  . Cancer Mother     Breast  . Cancer Father     Throat  . Cancer Maternal Grandmother     Pancreas  . Heart Problems Maternal Grandfather     Poor Circulation  . Cancer Brother   . Cancer Sister   . Graves' disease Sister     SOCHx:   reports that she has never smoked. She has never used smokeless tobacco. She reports that she does not drink alcohol or use drugs.  ALLERGIES:  Allergies  Allergen Reactions  . Penicillins Other (See Comments)    States it made her body smell when she took it Pt does not tolerate smell    ROS: Pertinent items noted in HPI and remainder of comprehensive ROS otherwise negative.  HOME MEDS: Current Outpatient Prescriptions  Medication Sig Dispense Refill  . Calcium Citrate-Vitamin D (CALCIUM CITRATE + PO) Take 1 tablet by mouth at bedtime.    . calcium-vitamin D (OSCAL WITH D) 500-200 MG-UNIT per tablet Take 1 tablet by mouth daily.    . Fluticasone Propionate (FLONASE NA) Place 1 spray into the nose as needed.     . metoprolol succinate (TOPROL-XL) 25 MG 24 hr tablet TAKE 1/2 TABLET BY MOUTH EVERY DAY 45 tablet 1  . Multiple Vitamin (MULTIVITAMIN WITH MINERALS) TABS Take 1 tablet by mouth daily.    Marland Kitchen omeprazole (PRILOSEC) 20 MG capsule Take 20 mg by mouth daily.    . pravastatin (PRAVACHOL) 40 MG tablet Take 40 mg by mouth daily.    . valsartan (DIOVAN) 40 MG tablet Take 40 mg by mouth daily.    . vitamin C (ASCORBIC ACID) 500 MG tablet Take 500 mg by mouth daily.    Alveda Reasons 20 MG TABS tablet TAKE 1 TABLET BY MOUTH EVERY DAY WITH SUPPER 90 tablet 1   No current facility-administered medications for this visit.     LABS/IMAGING: No results found for this or any previous visit (from the  past 48 hour(s)). No results found.  WEIGHTS: Wt Readings from Last 3 Encounters:  07/18/16 146 lb (66.2 kg)  11/18/15 150 lb 3.2 oz (68.1 kg)  10/05/15 149 lb 3.2 oz (67.7 kg)    VITALS: BP (!) 144/82   Pulse 74   Ht 5\' 5"  (1.651 m)   Wt 146 lb (66.2 kg)   BMI 24.30 kg/m   EXAM: General appearance: alert and no distress Neck: no carotid bruit and no JVD Lungs: clear to auscultation bilaterally Heart: regular rate and rhythm, S1, S2 normal, no murmur, click, rub or gallop Abdomen: soft, non-tender; bowel sounds normal; no masses,  no organomegaly Extremities: extremities normal, atraumatic, no cyanosis or edema Pulses: 2+ and symmetric Skin: Skin color, texture, turgor normal. No rashes or lesions Neurologic: Grossly normal  EKG: Normal sinus rhythm at 74  ASSESSMENT: 1. Palpitations - PAF noted on the  monitor, asymptomatic (CHADSVASC score 3) - on Xarelto 2. Hypertension-controlled 3. Dyslipidemia-at goal 4. History of breast cancer status post right mastectomy  PLAN: 1.   Shelley Gutierrez is doing well on Xarelto for PAF. She remains mostly in sinus rhythm. Blood pressure was mildly elevated today, but she reports has been in the 120's and 130's at home. Cholesterol is at goal and followed by PCP. Follow-up with me in 6 months or sooner as necessary.  Pixie Casino, MD, [facility] Attending Cardiologist [place] Loreli Debruler 07/18/2016, 10:16 AM

## 2016-10-05 ENCOUNTER — Other Ambulatory Visit: Payer: Medicare Other

## 2016-10-05 ENCOUNTER — Encounter: Payer: Medicare Other | Admitting: Nurse Practitioner

## 2016-10-09 ENCOUNTER — Other Ambulatory Visit: Payer: Self-pay | Admitting: Adult Health

## 2016-10-09 DIAGNOSIS — C50411 Malignant neoplasm of upper-outer quadrant of right female breast: Secondary | ICD-10-CM

## 2016-10-09 NOTE — Progress Notes (Signed)
CLINIC:  Survivorship   REASON FOR VISIT:  Routine follow-up for history of breast cancer.   BRIEF ONCOLOGIC HISTORY:  Per Dr. Geralyn Gutierrez note in June, 2017:  Right breast cancer diagnosed in 1996 status post right mastectomy with axillary lymph node dissection with immediate TRAM reconstruction (I do not have any proper records of pathological staging or receptor status)  INTERVAL HISTORY:  Shelley Gutierrez presents to the [facility] today for routine follow-up for her history of breast cancer.  Overall, she reports feeling quite well. She sees her PCP regularly and exercises most days of the week.  She is without any questions or concerns regarding her breasts.      REVIEW OF SYSTEMS:  Review of Systems  Constitutional: Negative for appetite change, chills, diaphoresis, fatigue, fever and unexpected weight change.  HENT:   Negative for hearing loss and lump/mass.   Eyes: Negative for eye problems and icterus.  Respiratory: Negative for chest tightness, cough and shortness of breath.   Cardiovascular: Negative for chest pain, leg swelling and palpitations.  Gastrointestinal: Negative for abdominal distention, abdominal pain, constipation, diarrhea, nausea and vomiting.  Endocrine: Negative for hot flashes.  Genitourinary: Negative for difficulty urinating.   Musculoskeletal: Negative for arthralgias.  Skin: Negative for itching and rash.  Neurological: Negative for dizziness.  Hematological: Negative for adenopathy.  Psychiatric/Behavioral: The patient is not nervous/anxious.    Breast: Denies any new nodularity, masses, tenderness, nipple changes, or nipple discharge.        PAST MEDICAL/SURGICAL HISTORY:  Past Medical History:  Diagnosis Date  . Arthritis    Bursitis right shoulder  . Atypical chest pain 02/21/2011   Nuc, EF-83, Normal stress test  . Breast cancer ([place]) 03/03/2011   Right mastectomy- '96-surgery only for breast cancer  . Cyclical neutropenia  ([place]) 03/03/2011  . Dyslipidemia   . GERD (gastroesophageal reflux disease)   . Hypercholesteremia   . Hypertension   . Pulsatile tinnitus 03/08/2012   Carotid doppler, mild amount of fibrous plaque, no evidence of significant diameter reduction   Past Surgical History:  Procedure Laterality Date  . BREAST RECONSTRUCTION  1996   Right Side  . BREAST RECONSTRUCTION  2010   Right Side  . CATARACT EXTRACTION, BILATERAL Bilateral   . COLONOSCOPY WITH PROPOFOL N/A 01/12/2015   Procedure: COLONOSCOPY WITH PROPOFOL;  Surgeon: Garlan Fair, MD;  Location: [facility];  Service: Endoscopy;  Laterality: N/A;  . MASTECTOMY  1996   Right Side     ALLERGIES:  Allergies  Allergen Reactions  . Penicillins Other (See Comments)    States it made her body smell when she took it Pt does not tolerate smell     CURRENT MEDICATIONS:  Outpatient Encounter Prescriptions as of 10/10/2016  Medication Sig  . Fluticasone Propionate (FLONASE NA) Place 1 spray into the nose as needed.   . metoprolol succinate (TOPROL-XL) 25 MG 24 hr tablet TAKE 1/2 TABLET BY MOUTH EVERY DAY  . Multiple Vitamin (MULTIVITAMIN WITH MINERALS) TABS Take 1 tablet by mouth daily.  . pravastatin (PRAVACHOL) 40 MG tablet Take 40 mg by mouth daily.  . ranitidine (ZANTAC) 150 MG tablet Take 150 mg by mouth at bedtime.  . valsartan (DIOVAN) 40 MG tablet Take 40 mg by mouth daily.  . vitamin C (ASCORBIC ACID) 500 MG tablet Take 500 mg by mouth daily.  Shelley Gutierrez 20 MG TABS tablet TAKE 1 TABLET BY MOUTH EVERY DAY WITH SUPPER (Patient taking differently: TAKE 1 TABLET BY MOUTH  EVERY DAY IN MORNING)  . [DISCONTINUED] Calcium Citrate-Vitamin D (CALCIUM CITRATE + PO) Take 1 tablet by mouth at bedtime.  . [DISCONTINUED] calcium-vitamin D (OSCAL WITH D) 500-200 MG-UNIT per tablet Take 1 tablet by mouth daily.  . [DISCONTINUED] omeprazole (PRILOSEC) 20 MG capsule Take 20 mg by mouth daily.   No facility-administered encounter  medications on file as of 10/10/2016.      ONCOLOGIC FAMILY HISTORY:  Family History  Problem Relation Age of Onset  . Cancer Mother        Breast  . Cancer Father        Throat  . Cancer Maternal Grandmother        Pancreas  . Heart Problems Maternal Grandfather        Poor Circulation  . Cancer Brother   . Cancer Sister   . Graves' disease Sister     GENETIC COUNSELING/TESTING: Not tested, has seen [facility] previously  SOCIAL HISTORY:  Shelley Gutierrez is married and lives with her husband in [place], [place].  She has 1 daughter and they live in [place].  Shelley Gutierrez is currently retired.  She denies any current or history of tobacco, alcohol, or illicit drug use.     PHYSICAL EXAMINATION:  Vital Signs: Vitals:   10/10/16 1051  BP: 138/79  Pulse: 66  Resp: 20  Temp: 98.4 F (36.9 C)   Filed Weights   10/10/16 1051  Weight: 147 lb 3.2 oz (66.8 kg)   General: Well-nourished, well-appearing female in no acute distress.  Unaccompanied today.   HEENT: Head is normocephalic.  Pupils equal and reactive to light. Conjunctivae clear without exudate.  Sclerae anicteric. Oral mucosa is pink, moist.  Oropharynx is pink without lesions or erythema.  Lymph: No cervical, supraclavicular, or infraclavicular lymphadenopathy noted on palpation.  Cardiovascular: Regular rate and rhythm.Marland Kitchen Respiratory: Clear to auscultation bilaterally. Chest expansion symmetric; breathing non-labored.  Breast Exam:  -Left breast: No appreciable masses on palpation. No skin redness, thickening, or peau d'orange appearance; no nipple retraction or nipple discharge; mild distortion in symmetry at previous lumpectomy site well healed scar without erythema or nodularity.  -Right breast: No appreciable masses on palpation. No skin redness, thickening, or peau d'orange appearance.  She is s/p mastectomy and reconstruction -Axilla: No axillary adenopathy bilaterally.  GI: Abdomen soft  and round; non-tender, non-distended. Bowel sounds normoactive. No hepatosplenomegaly.   GU: Deferred.  Neuro: No focal deficits. Steady gait.  Psych: Mood and affect normal and appropriate for situation.  Extremities: No edema. Skin: Warm and dry.  LABORATORY DATA:  Appointment on 10/10/2016  Component Date Value Ref Range Status  . WBC 10/10/2016 3.2* 3.9 - 10.3 10e3/uL Final  . NEUT# 10/10/2016 1.5  1.5 - 6.5 10e3/uL Final  . HGB 10/10/2016 13.9  11.6 - 15.9 g/dL Final  . HCT 10/10/2016 41.8  34.8 - 46.6 % Final  . Platelets 10/10/2016 191  145 - 400 10e3/uL Final  . MCV 10/10/2016 90.6  79.5 - 101.0 fL Final  . MCH 10/10/2016 30.1  25.1 - 34.0 pg Final  . MCHC 10/10/2016 33.2  31.5 - 36.0 g/dL Final  . RBC 10/10/2016 4.61  3.70 - 5.45 10e6/uL Final  . RDW 10/10/2016 13.4  11.2 - 14.5 % Final  . lymph# 10/10/2016 1.3  0.9 - 3.3 10e3/uL Final  . MONO# 10/10/2016 0.4  0.1 - 0.9 10e3/uL Final  . Eosinophils Absolute 10/10/2016 0.0  0.0 - 0.5 10e3/uL Final  .  Basophils Absolute 10/10/2016 0.0  0.0 - 0.1 10e3/uL Final  . NEUT% 10/10/2016 47.9  38.4 - 76.8 % Final  . LYMPH% 10/10/2016 39.0  14.0 - 49.7 % Final  . MONO% 10/10/2016 11.1  0.0 - 14.0 % Final  . EOS% 10/10/2016 1.3  0.0 - 7.0 % Final  . BASO% 10/10/2016 0.7  0.0 - 2.0 % Final  . Sodium 10/10/2016 140  136 - 145 mEq/L Final  . Potassium 10/10/2016 4.1  3.5 - 5.1 mEq/L Final  . Chloride 10/10/2016 106  98 - 109 mEq/L Final  . CO2 10/10/2016 27  22 - 29 mEq/L Final  . Glucose 10/10/2016 94  70 - 140 mg/dl Final   Glucose reference range is for nonfasting patients. Fasting glucose reference range is 70- 100.  Marland Kitchen BUN 10/10/2016 14.6  7.0 - 26.0 mg/dL Final  . Creatinine 10/10/2016 0.9  0.6 - 1.1 mg/dL Final  . Total Bilirubin 10/10/2016 0.45  0.20 - 1.20 mg/dL Final  . Alkaline Phosphatase 10/10/2016 69  40 - 150 U/L Final  . AST 10/10/2016 18  5 - 34 U/L Final  . ALT 10/10/2016 17  0 - 55 U/L Final  . Total Protein  10/10/2016 7.2  6.4 - 8.3 g/dL Final  . Albumin 10/10/2016 4.0  3.5 - 5.0 g/dL Final  . Calcium 10/10/2016 10.6* 8.4 - 10.4 mg/dL Final  . Anion Gap 10/10/2016 6  3 - 11 mEq/L Final  . EGFR 10/10/2016 74* >90 ml/min/1.73 m2 Final   eGFR is calculated using the CKD-EPI Creatinine Equation (2009)     DIAGNOSTIC IMAGING:  Most recent mammogram:      ASSESSMENT AND PLAN:  Ms.. Cipriani is a pleasant 71 y.o. female with history of right breast cancer s/p mastectomy and reconstruction from 1996.  [facility] for surveillance and routine follow-up.   1. History of breast cancer:  Ms. Mena is currently clinically and radiographically without evidence of disease or recurrence of breast cancer. She will be due for mammogram in 12/06/2016; orders placed today.   I encouraged her to call me with any questions or concerns before her next visit at the cancer center, and I would be happy to see her sooner, if needed.    2. Hypercalcemia.  Her labs today show a calcium level of 10.6.  She will stop taking her calcium supplementation and have this rechecked by her PCP in a month or so.    3. Bone health:  Given Ms. Persinger's age, history of breast cancer, she is at risk for bone demineralization. Her last DEXA scan was a couple of years ago and the results weref normal.   She was given education on specific food and activities to promote bone health.  4. Cancer screening:  Due to Ms. Arp's history and her age, she should receive screening for skin cancers, colon cancer. She was encouraged to follow-up with her PCP for appropriate cancer screenings.   5. Health maintenance and wellness promotion: Ms. Scrogham was encouraged to consume 5-7 servings of fruits and vegetables per day. She was also encouraged to engage in moderate to vigorous exercise for 30 minutes per day most days of the week. She was instructed to limit her alcohol consumption and continue to abstain from tobacco  use.    Dispo:  -Return to cancer center in one year for LTS follow up -Mammogram 12/06/2016   A total of (30) minutes of face-to-face time was spent with this  patient with greater than 50% of that time in counseling and care-coordination.   Gardenia Phlegm, NP [facility] [place] [phone]   Note: PRIMARY CARE PROVIDER Chesley Noon, Door [phone]

## 2016-10-10 ENCOUNTER — Ambulatory Visit (HOSPITAL_BASED_OUTPATIENT_CLINIC_OR_DEPARTMENT_OTHER): Payer: Medicare Other | Admitting: Adult Health

## 2016-10-10 ENCOUNTER — Other Ambulatory Visit (HOSPITAL_BASED_OUTPATIENT_CLINIC_OR_DEPARTMENT_OTHER): Payer: Medicare Other

## 2016-10-10 ENCOUNTER — Encounter: Payer: Self-pay | Admitting: Adult Health

## 2016-10-10 VITALS — BP 138/79 | HR 66 | Temp 98.4°F | Resp 20 | Ht 65.0 in | Wt 147.2 lb

## 2016-10-10 DIAGNOSIS — C50411 Malignant neoplasm of upper-outer quadrant of right female breast: Secondary | ICD-10-CM

## 2016-10-10 DIAGNOSIS — Z853 Personal history of malignant neoplasm of breast: Secondary | ICD-10-CM | POA: Diagnosis present

## 2016-10-10 DIAGNOSIS — Z1239 Encounter for other screening for malignant neoplasm of breast: Secondary | ICD-10-CM

## 2016-10-10 LAB — COMPREHENSIVE METABOLIC PANEL
ALT: 17 U/L (ref 0–55)
ANION GAP: 6 meq/L (ref 3–11)
AST: 18 U/L (ref 5–34)
Albumin: 4 g/dL (ref 3.5–5.0)
Alkaline Phosphatase: 69 U/L (ref 40–150)
BUN: 14.6 mg/dL (ref 7.0–26.0)
CALCIUM: 10.6 mg/dL — AB (ref 8.4–10.4)
CHLORIDE: 106 meq/L (ref 98–109)
CO2: 27 meq/L (ref 22–29)
Creatinine: 0.9 mg/dL (ref 0.6–1.1)
EGFR: 74 mL/min/{1.73_m2} — ABNORMAL LOW (ref >90)
Glucose: 94 mg/dl (ref 70–140)
Potassium: 4.1 mEq/L (ref 3.5–5.1)
Sodium: 140 mEq/L (ref 136–145)
Total Bilirubin: 0.45 mg/dL (ref 0.20–1.20)
Total Protein: 7.2 g/dL (ref 6.4–8.3)

## 2016-10-10 LAB — CBC WITH DIFFERENTIAL/PLATELET
BASO%: 0.7 % (ref 0.0–2.0)
BASOS ABS: 0 10*3/uL (ref 0.0–0.1)
EOS%: 1.3 % (ref 0.0–7.0)
Eosinophils Absolute: 0 10*3/uL (ref 0.0–0.5)
HEMATOCRIT: 41.8 % (ref 34.8–46.6)
HGB: 13.9 g/dL (ref 11.6–15.9)
LYMPH#: 1.3 10*3/uL (ref 0.9–3.3)
LYMPH%: 39 % (ref 14.0–49.7)
MCH: 30.1 pg (ref 25.1–34.0)
MCHC: 33.2 g/dL (ref 31.5–36.0)
MCV: 90.6 fL (ref 79.5–101.0)
MONO#: 0.4 10*3/uL (ref 0.1–0.9)
MONO%: 11.1 % (ref 0.0–14.0)
NEUT#: 1.5 10*3/uL (ref 1.5–6.5)
NEUT%: 47.9 % (ref 38.4–76.8)
PLATELETS: 191 10*3/uL (ref 145–400)
RBC: 4.61 10*6/uL (ref 3.70–5.45)
RDW: 13.4 % (ref 11.2–14.5)
WBC: 3.2 10*3/uL — ABNORMAL LOW (ref 3.9–10.3)

## 2016-10-19 ENCOUNTER — Encounter: Payer: Medicare Other | Admitting: Nurse Practitioner

## 2016-12-12 ENCOUNTER — Ambulatory Visit
Admission: RE | Admit: 2016-12-12 | Discharge: 2016-12-12 | Disposition: A | Discharge status: Home / Self Care | Payer: Medicare Other | Source: Ambulatory Visit | Attending: Adult Health | Admitting: Adult Health

## 2016-12-12 DIAGNOSIS — Z1239 Encounter for other screening for malignant neoplasm of breast: Secondary | ICD-10-CM

## 2016-12-23 ENCOUNTER — Other Ambulatory Visit: Payer: Self-pay | Admitting: Internal Medicine

## 2016-12-23 DIAGNOSIS — I4891 Unspecified atrial fibrillation: Secondary | ICD-10-CM

## 2016-12-25 NOTE — Telephone Encounter (Signed)
REFILL 

## 2017-01-03 ENCOUNTER — Other Ambulatory Visit: Payer: Self-pay | Admitting: Internal Medicine

## 2017-01-03 DIAGNOSIS — I4891 Unspecified atrial fibrillation: Secondary | ICD-10-CM

## 2017-01-25 ENCOUNTER — Ambulatory Visit (INDEPENDENT_AMBULATORY_CARE_PROVIDER_SITE_OTHER): Payer: Medicare Other | Admitting: Internal Medicine

## 2017-01-25 ENCOUNTER — Encounter: Payer: Self-pay | Admitting: Internal Medicine

## 2017-01-25 VITALS — BP 138/82 | HR 70 | Ht 65.0 in | Wt 147.0 lb

## 2017-01-25 DIAGNOSIS — I4891 Unspecified atrial fibrillation: Secondary | ICD-10-CM | POA: Diagnosis not present

## 2017-01-25 DIAGNOSIS — E785 Hyperlipidemia, unspecified: Secondary | ICD-10-CM

## 2017-01-25 DIAGNOSIS — I10 Essential (primary) hypertension: Secondary | ICD-10-CM | POA: Diagnosis not present

## 2017-01-25 NOTE — Patient Instructions (Signed)
Your physician wants you to follow-up in: ONE YEAR with Dr. Hilty. You will receive a reminder letter in the mail two months in advance. If you don't receive a letter, please call our office to schedule the follow-up appointment.  

## 2017-01-26 NOTE — Progress Notes (Signed)
OFFICE NOTE  Chief Complaint:  Feels well  Primary Care Physician: Chesley Noon, MD  HPI:  Shelley Gutierrez is a pleasant 71 year old female that I first saw in 2012, but has not followed up with me in over 4 years. She was referred to me but Marcene Brawn for evaluation of an abnormal EKG. At that time she had Q waves in V1 and V2 that was read as septal infarct. She was having some chest discomfort and was sent for evaluation of coronary disease. Her past medical history significant for hypertension and dyslipidemia as well as breast cancer status post right mastectomy. She underwent a nuclear stress test at that time which was negative for ischemia. She did exercise on the treadmill and achieved 7 METS of exercise and the study was interpreted as low risk. Since then she's had no further chest pain, however recently she's had the development of some palpitations. She has been under some stress and recently had some cold and URI symptoms for which she's been taking over-the-counter NyQuil with a decongestant. She also drinks some caffeine on a daily basis. She reports her palpitations are mostly worse at night and generally resolved by the morning. She denies any chest pain or shortness of breath. EKG today shows sinus rhythm.  Shelley Gutierrez returns today for follow-up of her palpitations. She underwent monitoring but had some difficulty with the monitor. This was sent back to the company and she was re-fitted with a monitor. I did review her monitor results which showed episodes of paroxysmal atrial fibrillation which were fairly short-lived. Is not clear that she was symptomatic with these episodes. This patients CHA2DS2-VASc Score and unadjusted Ischemic Stroke Rate (% per year) is equal to 3.2 % stroke rate/year from a score of 3 (Above score calculated as 1 point each if present [CHF, HTN, DM, Vascular=MI/PAD/Aortic Plaque, Age if 65-74, or Female] Above score calculated as 2 points each if  present [Age > 75, or Stroke/TIA/TE]. I discussed the findings with her today and she had numerous questions for me. We spent greater than 30 minutes discussing the risks and benefits of anticoagulation, how anticoagulants work, why I believe she needs to be on something for rate control because of the possibility of recurrent A. fib and the fact that it may be associated with symptoms or significant tachycardia, etc. In addition, I asked Tommy Medal, PharmD, our anticoagulation pharmacist to talk with her about the risks and benefits of long-term anticoagulation. I propose today that she goes on to Xarelto 20 mg daily and discontinue aspirin. In addition I would recommend adding low-dose Toprol-XL 12.5 mg daily.  11/18/2015  Shelley Gutierrez returns today for follow-up. She reports she's had no further palpitations. She seems to be tolerating Xarelto without any difficulty. In general her energy level is good. There is no adverse bleeding.  07/18/2016  Shelley Gutierrez was seen today in follow-up. She denies any palpitations or recurrent A. fib. She's not had any difficulty with Xarelto. Blood pressure at home is between 315 and 176 systolic although was elevated slightly today.  01/25/2017  Shelley Gutierrez returns today for follow-up. Overall she's done well. She denies any recurrent A. fib. EKG shows sinus rhythm today. She is maintained on Xarelto without any bleeding problems. Blood pressures been well controlled. She's also on pravastatin which is followed by her primary care provider.  PMHx:  Past Medical History:  Diagnosis Date  . Arthritis    Bursitis right shoulder  .  Atypical chest pain 02/21/2011   Nuc, EF-83, Normal stress test  . Breast cancer ([address]) 03/03/2011   Right mastectomy- '96-surgery only for breast cancer  . Cyclical neutropenia ([place]) 03/03/2011  . Dyslipidemia   . GERD (gastroesophageal reflux disease)   . Hypercholesteremia   . Hypertension   . Pulsatile tinnitus 03/08/2012    Carotid doppler, mild amount of fibrous plaque, no evidence of significant diameter reduction    Past Surgical History:  Procedure Laterality Date  . BREAST RECONSTRUCTION  1996   Right Side  . BREAST RECONSTRUCTION  2010   Right Side  . CATARACT EXTRACTION, BILATERAL Bilateral   . COLONOSCOPY WITH PROPOFOL N/A 01/12/2015   Procedure: COLONOSCOPY WITH PROPOFOL;  Surgeon: Shelley Fair, MD;  Location: [facility];  Service: Endoscopy;  Laterality: N/A;  . MASTECTOMY  1996   Right Side    FAMHx:  Family History  Problem Relation Age of Onset  . Cancer Mother        Breast  . Cancer Father        Throat  . Cancer Maternal Grandmother        Pancreas  . Heart Problems Maternal Grandfather        Poor Circulation  . Cancer Brother   . Cancer Sister   . Graves' disease Sister     SOCHx:   reports that she has never smoked. She has never used smokeless tobacco. She reports that she does not drink alcohol or use drugs.  ALLERGIES:  Allergies  Allergen Reactions  . Penicillins Other (See Comments)    States it made her body smell when she took it Pt does not tolerate smell    ROS: Pertinent items noted in HPI and remainder of comprehensive ROS otherwise negative.  HOME MEDS: Current Outpatient Prescriptions  Medication Sig Dispense Refill  . Biotin 1000 MCG tablet Take 1,000 mcg by mouth 3 (three) times daily.    . Fluticasone Propionate (FLONASE NA) Place 1 spray into the nose as needed.     Marland Kitchen losartan (COZAAR) 50 MG tablet Take 50 mg by mouth daily.  3  . metoprolol succinate (TOPROL-XL) 25 MG 24 hr tablet Take 0.5 tablets (12.5 mg total) by mouth daily. KEEP OV. 45 tablet 1  . Multiple Vitamin (MULTIVITAMIN WITH MINERALS) TABS Take 1 tablet by mouth daily.    . pravastatin (PRAVACHOL) 40 MG tablet Take 40 mg by mouth daily.    . vitamin C (ASCORBIC ACID) 500 MG tablet Take 500 mg by mouth daily.    Alveda Reasons 20 MG TABS tablet TAKE 1 TABLET BY MOUTH EVERY DAY WITH  SUPPER 90 tablet 1   No current facility-administered medications for this visit.     LABS/IMAGING: No results found for this or any previous visit (from the past 48 hour(s)). No results found.  WEIGHTS: Wt Readings from Last 3 Encounters:  01/25/17 147 lb (66.7 kg)  10/10/16 147 lb 3.2 oz (66.8 kg)  07/18/16 146 lb (66.2 kg)    VITALS: BP 138/82   Pulse 70   Ht 5\' 5"  (1.651 m)   Wt 147 lb (66.7 kg)   BMI 24.46 kg/m   EXAM: General appearance: alert and no distress Neck: no carotid bruit and no JVD Lungs: clear to auscultation bilaterally Heart: regular rate and rhythm, S1, S2 normal, no murmur, click, rub or gallop Abdomen: soft, non-tender; bowel sounds normal; no masses,  no organomegaly Extremities: extremities normal, atraumatic, no cyanosis or edema Pulses: 2+  and symmetric Skin: Skin color, texture, turgor normal. No rashes or lesions Neurologic: Grossly normal  EKG: Normal sinus rhythm at 70 - personally reviewed  ASSESSMENT: 1. Palpitations - PAF noted on the monitor, asymptomatic (CHADSVASC score 3) - on Xarelto 2. Hypertension-controlled 3. Dyslipidemia-at goal 4. History of breast cancer status post right mastectomy  PLAN: 1.   Mrs. Devers continues to do well without recurrent A. fib. She is on Xarelto properly for CHADSVASC score of 3. She has no bleeding problems. Blood pressures been well controlled. A recent CBC shows no evidence of anemia. Her dyslipidemia is followed by her primary care provider. No changes to her medicines today.  Follow-up annually or sooner as necessary.  Pixie Casino, MD, [facility] Attending Cardiologist [place] 01/26/2017, 4:38 PM

## 2017-03-30 ENCOUNTER — Other Ambulatory Visit: Payer: Self-pay | Admitting: Pharmacist Clinician (PhC)/ Clinical Pharmacy Specialist

## 2017-03-30 DIAGNOSIS — I4891 Unspecified atrial fibrillation: Secondary | ICD-10-CM

## 2017-03-30 MED ORDER — RIVAROXABAN 20 MG PO TABS: ORAL_TABLET | ORAL | 1 refills | Status: DC

## 2017-06-21 ENCOUNTER — Other Ambulatory Visit: Payer: Self-pay

## 2017-06-21 DIAGNOSIS — I4891 Unspecified atrial fibrillation: Secondary | ICD-10-CM

## 2017-06-21 MED ORDER — METOPROLOL SUCCINATE ER 25 MG PO TB24: 12.5000 mg | ORAL_TABLET | Freq: Every day | ORAL | 1 refills | Status: DC

## 2017-07-10 ENCOUNTER — Inpatient Hospital Stay: Admission: RE | Admit: 2017-07-10 | Payer: Self-pay | Source: Ambulatory Visit

## 2017-07-10 ENCOUNTER — Other Ambulatory Visit (HOSPITAL_COMMUNITY)
Admission: RE | Admit: 2017-07-10 | Discharge: 2017-07-10 | Disposition: A | Discharge status: Home / Self Care | Payer: Medicare Other | Source: Ambulatory Visit | Attending: Obstetrics and Gynecology | Admitting: Obstetrics and Gynecology

## 2017-07-10 ENCOUNTER — Other Ambulatory Visit: Payer: Self-pay | Admitting: Obstetrics and Gynecology

## 2017-07-10 DIAGNOSIS — Z124 Encounter for screening for malignant neoplasm of cervix: Secondary | ICD-10-CM | POA: Diagnosis present

## 2017-07-12 LAB — CYTOLOGY - PAP
Diagnosis: NEGATIVE
HPV (WINDOPATH): NOT DETECTED

## 2017-07-17 ENCOUNTER — Telehealth: Payer: Self-pay

## 2017-07-17 NOTE — Telephone Encounter (Signed)
Returned pt call to confirm next appointment in June.  Cyndia Bent RN

## 2017-07-24 NOTE — Progress Notes (Signed)
Received pt recent lab work from PCP office. Dr.Gudena reviewed, sent to scan.

## 2017-10-23 ENCOUNTER — Telehealth: Payer: Self-pay | Admitting: Adult Health

## 2017-10-23 ENCOUNTER — Encounter: Payer: Self-pay | Admitting: Adult Health

## 2017-10-23 ENCOUNTER — Inpatient Hospital Stay: Payer: Medicare Other | Attending: Adult Health | Admitting: Adult Health

## 2017-10-23 ENCOUNTER — Inpatient Hospital Stay: Payer: Medicare Other

## 2017-10-23 VITALS — BP 134/78 | HR 72 | Temp 98.5°F | Resp 18 | Ht 65.0 in | Wt 150.7 lb

## 2017-10-23 DIAGNOSIS — C50411 Malignant neoplasm of upper-outer quadrant of right female breast: Secondary | ICD-10-CM

## 2017-10-23 DIAGNOSIS — Z1231 Encounter for screening mammogram for malignant neoplasm of breast: Secondary | ICD-10-CM | POA: Diagnosis not present

## 2017-10-23 DIAGNOSIS — Z1239 Encounter for other screening for malignant neoplasm of breast: Secondary | ICD-10-CM

## 2017-10-23 DIAGNOSIS — Z853 Personal history of malignant neoplasm of breast: Secondary | ICD-10-CM | POA: Insufficient documentation

## 2017-10-23 LAB — CBC WITH DIFFERENTIAL (CANCER CENTER ONLY)
Basophils Absolute: 0 10*3/uL (ref 0.0–0.1)
Basophils Relative: 1 %
EOS ABS: 0 10*3/uL (ref 0.0–0.5)
Eosinophils Relative: 1 %
HEMATOCRIT: 39.7 % (ref 34.8–46.6)
HEMOGLOBIN: 13.3 g/dL (ref 11.6–15.9)
LYMPHS ABS: 1.4 10*3/uL (ref 0.9–3.3)
Lymphocytes Relative: 38 %
MCH: 30.2 pg (ref 25.1–34.0)
MCHC: 33.6 g/dL (ref 31.5–36.0)
MCV: 90 fL (ref 79.5–101.0)
MONOS PCT: 11 %
Monocytes Absolute: 0.4 10*3/uL (ref 0.1–0.9)
NEUTROS PCT: 49 %
Neutro Abs: 1.7 10*3/uL (ref 1.5–6.5)
Platelet Count: 212 10*3/uL (ref 145–400)
RBC: 4.41 MIL/uL (ref 3.70–5.45)
RDW: 13.7 % (ref 11.2–14.5)
WBC Count: 3.6 10*3/uL — ABNORMAL LOW (ref 3.9–10.3)

## 2017-10-23 NOTE — Patient Instructions (Signed)
Bone Health Bones protect organs, store calcium, and anchor muscles. Good health habits, such as eating nutritious foods and exercising regularly, are important for maintaining healthy bones. They can also help to prevent a condition that causes bones to lose density and become weak and brittle (osteoporosis). Why is bone mass important? Bone mass refers to the amount of bone tissue that you have. The higher your bone mass, the stronger your bones. An important step toward having healthy bones throughout life is to have strong and dense bones during childhood. A young adult who has a high bone mass is more likely to have a high bone mass later in life. Bone mass at its greatest it is called peak bone mass. A large decline in bone mass occurs in older adults. In women, it occurs about the time of menopause. During this time, it is important to practice good health habits, because if more bone is lost than what is replaced, the bones will become less healthy and more likely to break (fracture). If you find that you have a low bone mass, you may be able to prevent osteoporosis or further bone loss by changing your diet and lifestyle. How can I find out if my bone mass is low? Bone mass can be measured with an X-ray test that is called a bone mineral density (BMD) test. This test is recommended for all women who are age 65 or older. It may also be recommended for men who are age 70 or older, or for people who are more likely to develop osteoporosis due to:  Having bones that break easily.  Having a long-term disease that weakens bones, such as kidney disease or rheumatoid arthritis.  Having menopause earlier than normal.  Taking medicine that weakens bones, such as steroids, thyroid hormones, or hormone treatment for breast cancer or prostate cancer.  Smoking.  Drinking three or more alcoholic drinks each day.  What are the nutritional recommendations for healthy bones? To have healthy bones, you  need to get enough of the right minerals and vitamins. Most nutrition experts recommend getting these nutrients from the foods that you eat. Nutritional recommendations vary from person to person. Ask your health care provider what is healthy for you. Here are some general guidelines. Calcium Recommendations Calcium is the most important (essential) mineral for bone health. Most people can get enough calcium from their diet, but supplements may be recommended for people who are at risk for osteoporosis. Good sources of calcium include:  Dairy products, such as low-fat or nonfat milk, cheese, and yogurt.  Dark green leafy vegetables, such as bok choy and broccoli.  Calcium-fortified foods, such as orange juice, cereal, bread, soy beverages, and tofu products.  Nuts, such as almonds.  Follow these recommended amounts for daily calcium intake:  Children, age 1?3: 700 mg.  Children, age 4?8: 1,000 mg.  Children, age 9?13: 1,300 mg.  Teens, age 14?18: 1,300 mg.  Adults, age 19?50: 1,000 mg.  Adults, age 51?70: ? Men: 1,000 mg. ? Women: 1,200 mg.  Adults, age 71 or older: 1,200 mg.  Pregnant and breastfeeding females: ? Teens: 1,300 mg. ? Adults: 1,000 mg.  Vitamin D Recommendations Vitamin D is the most essential vitamin for bone health. It helps the body to absorb calcium. Sunlight stimulates the skin to make vitamin D, so be sure to get enough sunlight. If you live in a cold climate or you do not get outside often, your health care provider may recommend that you take vitamin   D supplements. Good sources of vitamin D in your diet include:  Egg yolks.  Saltwater fish.  Milk and cereal fortified with vitamin D.  Follow these recommended amounts for daily vitamin D intake:  Children and teens, age 1?18: 600 international units.  Adults, age 50 or younger: 400-800 international units.  Adults, age 51 or older: 800-1,000 international units.  Other Nutrients Other nutrients  for bone health include:  Phosphorus. This mineral is found in meat, poultry, dairy foods, nuts, and legumes. The recommended daily intake for adult men and adult women is 700 mg.  Magnesium. This mineral is found in seeds, nuts, dark green vegetables, and legumes. The recommended daily intake for adult men is 400?420 mg. For adult women, it is 310?320 mg.  Vitamin K. This vitamin is found in green leafy vegetables. The recommended daily intake is 120 mg for adult men and 90 mg for adult women.  What type of physical activity is best for building and maintaining healthy bones? Weight-bearing and strength-building activities are important for building and maintaining peak bone mass. Weight-bearing activities cause muscles and bones to work against gravity. Strength-building activities increases muscle strength that supports bones. Weight-bearing and muscle-building activities include:  Walking and hiking.  Jogging and running.  Dancing.  Gym exercises.  Lifting weights.  Tennis and racquetball.  Climbing stairs.  Aerobics.  Adults should get at least 30 minutes of moderate physical activity on most days. Children should get at least 60 minutes of moderate physical activity on most days. Ask your health care provide what type of exercise is best for you. Where can I find more information? For more information, check out the following websites:  National Osteoporosis Foundation: http://nof.org/learn/basics  National Institutes of Health: http://www.niams.nih.gov/Health_Info/Bone/Bone_Health/bone_health_for_life.asp  This information is not intended to replace advice given to you by your health care provider. Make sure you discuss any questions you have with your health care provider. Document Released: 07/08/2003 Document Revised: 11/05/2015 Document Reviewed: 04/22/2014 Elsevier Interactive Patient Education  2018 Elsevier Inc.  

## 2017-10-23 NOTE — Progress Notes (Signed)
CLINIC:  Survivorship   REASON FOR VISIT:  Routine follow-up for history of breast cancer.   BRIEF ONCOLOGIC HISTORY:  Per Dr. Geralyn Flash note in June, 2017:  Right breast cancer diagnosed in 1996 status post right mastectomy with axillary lymph node dissection with immediate TRAM reconstruction (I do not have any proper records of pathological staging or receptor status)    INTERVAL HISTORY:  Shelley Gutierrez presents to the [facility] today for routine follow-up for her history of breast cancer.  Overall, she reports feeling quite well.  She continues to see her PCP annually and is up to date with her cancer screenings.  She doesn't really exercise in the winter, but enjoys the warm weather because she works in her garden.      REVIEW OF SYSTEMS:  Review of Systems  Constitutional: Negative for appetite change, chills, fatigue, fever and unexpected weight change.  HENT:   Negative for hearing loss, lump/mass and trouble swallowing.   Eyes: Negative for eye problems and icterus.  Respiratory: Negative for chest tightness, cough and shortness of breath.   Cardiovascular: Negative for chest pain, leg swelling and palpitations.  Gastrointestinal: Negative for abdominal distention, abdominal pain, constipation, diarrhea, nausea and vomiting.  Endocrine: Negative for hot flashes.  Skin: Negative for itching and rash.  Neurological: Negative for dizziness, extremity weakness and numbness.  Hematological: Negative for adenopathy. Does not bruise/bleed easily.  Psychiatric/Behavioral: Negative for depression. The patient is not nervous/anxious.    Breast: Denies any new nodularity, masses, tenderness, nipple changes, or nipple discharge.       PAST MEDICAL/SURGICAL HISTORY:  Past Medical History:  Diagnosis Date  . Arthritis    Bursitis right shoulder  . Atypical chest pain 02/21/2011   Nuc, EF-83, Normal stress test  . Breast cancer ([place]) 03/03/2011   Right mastectomy-  '96-surgery only for breast cancer  . Cyclical neutropenia ([place]) 03/03/2011  . Dyslipidemia   . GERD (gastroesophageal reflux disease)   . Hypercholesteremia   . Hypertension   . Pulsatile tinnitus 03/08/2012   Carotid doppler, mild amount of fibrous plaque, no evidence of significant diameter reduction   Past Surgical History:  Procedure Laterality Date  . BREAST RECONSTRUCTION  1996   Right Side  . BREAST RECONSTRUCTION  2010   Right Side  . CATARACT EXTRACTION, BILATERAL Bilateral   . COLONOSCOPY WITH PROPOFOL N/A 01/12/2015   Procedure: COLONOSCOPY WITH PROPOFOL;  Surgeon: Garlan Fair, MD;  Location: [facility];  Service: Endoscopy;  Laterality: N/A;  . MASTECTOMY  1996   Right Side     ALLERGIES:  Allergies  Allergen Reactions  . Penicillins Other (See Comments)    States it made her body smell when she took it Pt does not tolerate smell     CURRENT MEDICATIONS:  Outpatient Encounter Medications as of 10/23/2017  Medication Sig  . loratadine (CLARITIN) 10 MG tablet Take 10 mg by mouth as needed for allergies.  Marland Kitchen losartan (COZAAR) 50 MG tablet Take 50 mg by mouth daily.  . metoprolol succinate (TOPROL-XL) 25 MG 24 hr tablet Take 0.5 tablets (12.5 mg total) by mouth daily.  . Multiple Vitamin (MULTIVITAMIN WITH MINERALS) TABS Take 1 tablet by mouth daily.  . pravastatin (PRAVACHOL) 40 MG tablet Take 40 mg by mouth daily.  . rivaroxaban (XARELTO) 20 MG TABS tablet TAKE 1 TABLET BY MOUTH EVERY DAY WITH SUPPER  . vitamin C (ASCORBIC ACID) 500 MG tablet Take 500 mg by mouth daily.  . [DISCONTINUED] Biotin  1000 MCG tablet Take 1,000 mcg by mouth 3 (three) times daily.  . [DISCONTINUED] Fluticasone Propionate (FLONASE NA) Place 1 spray into the nose as needed.    No facility-administered encounter medications on file as of 10/23/2017.      ONCOLOGIC FAMILY HISTORY:  Family History  Problem Relation Age of Onset  . Cancer Mother        Breast  . Cancer Father         Throat  . Cancer Maternal Grandmother        Pancreas  . Heart Problems Maternal Grandfather        Poor Circulation  . Cancer Brother   . Cancer Sister   . Graves' disease Sister      SOCIAL HISTORY:  Social History   Socioeconomic History  . Marital status: Married    Spouse name: Not on file  . Number of children: Not on file  . Years of education: Not on file  . Highest education level: Not on file  Occupational History  . Not on file  Social Needs  . Financial resource strain: Not on file  . Food insecurity:    Worry: Not on file    Inability: Not on file  . Transportation needs:    Medical: Not on file    Non-medical: Not on file  Tobacco Use  . Smoking status: Never Smoker  . Smokeless tobacco: Never Used  Substance and Sexual Activity  . Alcohol use: No  . Drug use: No  . Sexual activity: Not Currently  Lifestyle  . Physical activity:    Days per week: Not on file    Minutes per session: Not on file  . Stress: Not on file  Relationships  . Social connections:    Talks on phone: Not on file    Gets together: Not on file    Attends religious service: Not on file    Active member of club or organization: Not on file    Attends meetings of clubs or organizations: Not on file    Relationship status: Not on file  . Intimate partner violence:    Fear of current or ex partner: Not on file    Emotionally abused: Not on file    Physically abused: Not on file    Forced sexual activity: Not on file  Other Topics Concern  . Not on file  Social History Narrative   Epworth Sleepiness Scale         Score Total: 8         --I have high blood pressure      --I wake up to urinate frequently at night      --I have been told that I snore      PHYSICAL EXAMINATION:  Vital Signs: Vitals:   10/23/17 1115  BP: 134/78  Pulse: 72  Resp: 18  Temp: 98.5 F (36.9 C)  SpO2: 100%   Filed Weights   10/23/17 1115  Weight: 150 lb 11.2 oz (68.4 kg)    General: Well-nourished, well-appearing female in no acute distress.  Unaccompanied today.   HEENT: Head is normocephalic.  Pupils equal and reactive to light. Conjunctivae clear without exudate.  Sclerae anicteric. Oral mucosa is pink, moist.  Oropharynx is pink without lesions or erythema.  Lymph: No cervical, supraclavicular, or infraclavicular lymphadenopathy noted on palpation.  Cardiovascular: Regular rate and rhythm.Marland Kitchen Respiratory: Clear to auscultation bilaterally. Chest expansion symmetric; breathing non-labored.  Breast Exam:  -Left breast: No appreciable  masses on palpation. No skin redness, thickening, or peau d'orange appearance; no nipple retraction or nipple discharge;   -Right breast: s/p mastectomy and reconstruction, no nodules or masses noted -Axilla: No axillary adenopathy bilaterally.  GI: Abdomen soft and round; non-tender, non-distended. Bowel sounds normoactive. No hepatosplenomegaly.   GU: Deferred.  Neuro: No focal deficits. Steady gait.  Psych: Mood and affect normal and appropriate for situation.  MSK: No focal spinal tenderness to palpation, full range of motion in bilateral upper extremities Extremities: No edema. Skin: Warm and dry.  LABORATORY DATA:  None for this visit   DIAGNOSTIC IMAGING:  Most recent mammogram:      ASSESSMENT AND PLAN:  Ms.. Gutierrez is a pleasant 72 y.o. female with history ofright bresat cancer s/p mastectomy and reconstruction in 1996.  [facility] for surveillance and routine follow-up.   1. History of breast cancer:  Shelley Gutierrez is currently clinically and radiographically without evidence of disease or recurrence of breast cancer. She will be due for mammogram in 11/2017; orders placed today.   She will return in one year for LTS follow up.  I encouraged her to call me with any questions or concerns before her next visit at the cancer center, and I would be happy to see her sooner, if needed.    2.  Bone health:  Given Shelley Gutierrez's age, history of breast cancer, she is at risk for bone demineralization. I will defer to her PCP regarding bone density testing and management. She was given education on specific food and activities to promote bone health.  3. Cancer screening:  Due to Shelley Gutierrez's history and her age, she should receive screening for skin cancers, colon cancer. She was encouraged to follow-up with her PCP for appropriate cancer screenings.   4. Health maintenance and wellness promotion: Shelley Gutierrez was encouraged to consume 5-7 servings of fruits and vegetables per day. She was also encouraged to engage in moderate to vigorous exercise for 30 minutes per day most days of the week. She was instructed to limit her alcohol consumption and continue to abstain from tobacco use.    Dispo:  -Return to cancer center in one year for LTS follow up -Mammogram in 11/2017   A total of (30) minutes of face-to-face time was spent with this patient with greater than 50% of that time in counseling and care-coordination.   Gardenia Phlegm, NP [facility] [place] [phone]   Note: PRIMARY CARE PROVIDER Chesley Noon, [place] [phone]

## 2017-10-23 NOTE — Telephone Encounter (Signed)
Per 6/25 template is not complete for year appointment patient would like august

## 2017-10-31 ENCOUNTER — Other Ambulatory Visit: Payer: Self-pay | Admitting: Internal Medicine

## 2017-10-31 DIAGNOSIS — I4891 Unspecified atrial fibrillation: Secondary | ICD-10-CM

## 2017-12-16 ENCOUNTER — Other Ambulatory Visit: Payer: Self-pay | Admitting: Internal Medicine

## 2017-12-16 DIAGNOSIS — I4891 Unspecified atrial fibrillation: Secondary | ICD-10-CM

## 2017-12-17 ENCOUNTER — Ambulatory Visit: Payer: Medicare Other

## 2017-12-19 ENCOUNTER — Ambulatory Visit
Admission: RE | Admit: 2017-12-19 | Discharge: 2017-12-19 | Disposition: A | Discharge status: Home / Self Care | Payer: Medicare Other | Source: Ambulatory Visit | Attending: Adult Health | Admitting: Adult Health

## 2017-12-19 DIAGNOSIS — Z1239 Encounter for other screening for malignant neoplasm of breast: Secondary | ICD-10-CM

## 2018-01-30 ENCOUNTER — Ambulatory Visit: Payer: Medicare Other | Admitting: Internal Medicine

## 2018-02-18 ENCOUNTER — Encounter: Payer: Self-pay | Admitting: Internal Medicine

## 2018-02-18 ENCOUNTER — Ambulatory Visit (INDEPENDENT_AMBULATORY_CARE_PROVIDER_SITE_OTHER): Payer: Medicare Other | Admitting: Internal Medicine

## 2018-02-18 VITALS — BP 122/72 | HR 70 | Ht 65.0 in | Wt 153.8 lb

## 2018-02-18 DIAGNOSIS — I480 Paroxysmal atrial fibrillation: Secondary | ICD-10-CM | POA: Diagnosis not present

## 2018-02-18 DIAGNOSIS — I10 Essential (primary) hypertension: Secondary | ICD-10-CM

## 2018-02-18 DIAGNOSIS — E785 Hyperlipidemia, unspecified: Secondary | ICD-10-CM

## 2018-02-18 NOTE — Patient Instructions (Signed)

## 2018-02-18 NOTE — Progress Notes (Signed)
OFFICE NOTE  Chief Complaint:  No complaints  Primary Care Physician: Chesley Noon, MD  HPI:  Shelley Gutierrez is a pleasant 72 year old female that I first saw in 2012, but has not followed up with me in over 4 years. She was referred to me but Marcene Brawn for evaluation of an abnormal EKG. At that time she had Q waves in V1 and V2 that was read as septal infarct. She was having some chest discomfort and was sent for evaluation of coronary disease. Her past medical history significant for hypertension and dyslipidemia as well as breast cancer status post right mastectomy. She underwent a nuclear stress test at that time which was negative for ischemia. She did exercise on the treadmill and achieved 7 METS of exercise and the study was interpreted as low risk. Since then she's had no further chest pain, however recently she's had the development of some palpitations. She has been under some stress and recently had some cold and URI symptoms for which she's been taking over-the-counter NyQuil with a decongestant. She also drinks some caffeine on a daily basis. She reports her palpitations are mostly worse at night and generally resolved by the morning. She denies any chest pain or shortness of breath. EKG today shows sinus rhythm.  Shelley Gutierrez returns today for follow-up of her palpitations. She underwent monitoring but had some difficulty with the monitor. This was sent back to the company and she was re-fitted with a monitor. I did review her monitor results which showed episodes of paroxysmal atrial fibrillation which were fairly short-lived. Is not clear that she was symptomatic with these episodes. This patients CHA2DS2-VASc Score and unadjusted Ischemic Stroke Rate (% per year) is equal to 3.2 % stroke rate/year from a score of 3 (Above score calculated as 1 point each if present [CHF, HTN, DM, Vascular=MI/PAD/Aortic Plaque, Age if 65-74, or Female] Above score calculated as 2 points each if  present [Age > 75, or Stroke/TIA/TE]. I discussed the findings with her today and she had numerous questions for me. We spent greater than 30 minutes discussing the risks and benefits of anticoagulation, how anticoagulants work, why I believe she needs to be on something for rate control because of the possibility of recurrent A. fib and the fact that it may be associated with symptoms or significant tachycardia, etc. In addition, I asked Tommy Medal, PharmD, our anticoagulation pharmacist to talk with her about the risks and benefits of long-term anticoagulation. I propose today that she goes on to Xarelto 20 mg daily and discontinue aspirin. In addition I would recommend adding low-dose Toprol-XL 12.5 mg daily.  11/18/2015  Shelley Gutierrez returns today for follow-up. She reports she's had no further palpitations. She seems to be tolerating Xarelto without any difficulty. In general her energy level is good. There is no adverse bleeding.  07/18/2016  Shelley Gutierrez was seen today in follow-up. She denies any palpitations or recurrent A. fib. She's not had any difficulty with Xarelto. Blood pressure at home is between 256 and 389 systolic although was elevated slightly today.  01/25/2017  Shelley Gutierrez returns today for follow-up. Overall she's done well. She denies any recurrent A. fib. EKG shows sinus rhythm today. She is maintained on Xarelto without any bleeding problems. Blood pressures been well controlled. She's also on pravastatin which is followed by her primary care provider.  02/18/2018  Shelley Gutierrez returns today for follow-up.  She is done really well.  Her blood pressure is  good today.  She denies any A. fib.  She has been on Xarelto without any bleeding problems.  Cholesterol is followed by her PCP.   PMHx:  Past Medical History:  Diagnosis Date  . Arthritis    Bursitis right shoulder  . Atypical chest pain 02/21/2011   Nuc, EF-83, Normal stress test  . Breast cancer ([place]) 03/03/2011     Right mastectomy- '96-surgery only for breast cancer  . Cyclical neutropenia ([place]) 03/03/2011  . Dyslipidemia   . GERD (gastroesophageal reflux disease)   . Hypercholesteremia   . Hypertension   . Pulsatile tinnitus 03/08/2012   Carotid doppler, mild amount of fibrous plaque, no evidence of significant diameter reduction    Past Surgical History:  Procedure Laterality Date  . BREAST RECONSTRUCTION  1996   Right Side  . BREAST RECONSTRUCTION  2010   Right Side  . CATARACT EXTRACTION, BILATERAL Bilateral   . COLONOSCOPY WITH PROPOFOL N/A 01/12/2015   Procedure: COLONOSCOPY WITH PROPOFOL;  Surgeon: Garlan Fair, MD;  Location: [facility];  Service: Endoscopy;  Laterality: N/A;  . MASTECTOMY  1996   Right Side    FAMHx:  Family History  Problem Relation Age of Onset  . Cancer Mother        Breast  . Cancer Father        Throat  . Cancer Maternal Grandmother        Pancreas  . Heart Problems Maternal Grandfather        Poor Circulation  . Cancer Brother   . Cancer Sister   . Graves' disease Sister     SOCHx:   reports that she has never smoked. She has never used smokeless tobacco. She reports that she does not drink alcohol or use drugs.  ALLERGIES:  Allergies  Allergen Reactions  . Penicillins Other (See Comments)    States it made her body smell when she took it Pt does not tolerate smell    ROS: Pertinent items noted in HPI and remainder of comprehensive ROS otherwise negative.  HOME MEDS: Current Outpatient Medications  Medication Sig Dispense Refill  . fexofenadine (ALLEGRA) 180 MG tablet Take 180 mg by mouth daily.  11  . losartan (COZAAR) 50 MG tablet Take 50 mg by mouth daily.  3  . metoprolol succinate (TOPROL-XL) 25 MG 24 hr tablet Take 0.5 tablets (12.5 mg total) by mouth daily. NEED OV. 45 tablet 1  . Multiple Vitamin (MULTIVITAMIN WITH MINERALS) TABS Take 1 tablet by mouth daily.    . pravastatin (PRAVACHOL) 40 MG tablet Take 40 mg by mouth  daily.    . vitamin C (ASCORBIC ACID) 500 MG tablet Take 500 mg by mouth daily.    Alveda Reasons 20 MG TABS tablet TAKE 1 TABLET BY MOUTH EVERY DAY WITH SUPPER 90 tablet 1   No current facility-administered medications for this visit.     LABS/IMAGING: No results found for this or any previous visit (from the past 48 hour(s)). No results found.  WEIGHTS: Wt Readings from Last 3 Encounters:  02/18/18 153 lb 12.8 oz (69.8 kg)  10/23/17 150 lb 11.2 oz (68.4 kg)  01/25/17 147 lb (66.7 kg)    VITALS: BP 122/72   Pulse 70   Ht 5\' 5"  (1.651 m)   Wt 153 lb 12.8 oz (69.8 kg)   BMI 25.59 kg/m   EXAM: General appearance: alert and no distress Neck: no carotid bruit and no JVD Lungs: clear to auscultation bilaterally Heart: regular rate  and rhythm, S1, S2 normal, no murmur, click, rub or gallop Abdomen: soft, non-tender; bowel sounds normal; no masses,  no organomegaly Extremities: extremities normal, atraumatic, no cyanosis or edema Pulses: 2+ and symmetric Skin: Skin color, texture, turgor normal. No rashes or lesions Neurologic: Grossly normal  EKG: Normal sinus rhythm at 70-personally reviewed  ASSESSMENT: 1. Palpitations - PAF noted on the monitor, asymptomatic (CHADSVASC score 3) - on Xarelto 2. Hypertension-controlled 3. Dyslipidemia-at goal 4. History of breast cancer status post right mastectomy  PLAN: 1.   Shelley Gutierrez denies any recurrent atrial fibrillation.  Her blood pressures been well controlled.  Cholesterol is been at goal.  She is tolerating Xarelto without any bleeding problems.  No changes to her medicines today  Follow-up annually or sooner as necessary.   Pixie Casino, MD, [facility], [place] Director of the Advanced Lipid Disorders &  Cardiovascular Risk Reduction Clinic Diplomate of the American Board of Clinical Lipidology Attending Cardiologist  Direct Dial: [phone]  Fax: [phone]  Website:   [email or web address] 02/18/2018, 9:37 AM

## 2018-05-11 ENCOUNTER — Other Ambulatory Visit: Payer: Self-pay | Admitting: Internal Medicine

## 2018-05-11 DIAGNOSIS — I4891 Unspecified atrial fibrillation: Secondary | ICD-10-CM

## 2018-06-11 ENCOUNTER — Other Ambulatory Visit: Payer: Self-pay | Admitting: Internal Medicine

## 2018-06-11 DIAGNOSIS — I4891 Unspecified atrial fibrillation: Secondary | ICD-10-CM

## 2018-11-09 ENCOUNTER — Other Ambulatory Visit: Payer: Self-pay | Admitting: Internal Medicine

## 2018-11-09 DIAGNOSIS — I4891 Unspecified atrial fibrillation: Secondary | ICD-10-CM

## 2018-11-11 NOTE — Telephone Encounter (Signed)
Pt is a 73 yr old female who saw Dr. Debara Pickett on 02/18/18. Weight at that visit was 69.8Kg. SCr on 06/25/18 in care-everywhere is 0.81. CrCl is 55mL/min. Will refill her Xarelto 20mg  QD.

## 2018-11-22 ENCOUNTER — Telehealth: Payer: Self-pay | Admitting: Adult Health

## 2018-11-22 NOTE — Telephone Encounter (Signed)
Cancel per 7/24 sch msg

## 2018-11-26 ENCOUNTER — Encounter: Payer: Medicare Other | Admitting: Adult Health

## 2018-11-27 ENCOUNTER — Other Ambulatory Visit: Payer: Self-pay | Admitting: Obstetrics and Gynecology

## 2018-11-27 ENCOUNTER — Other Ambulatory Visit: Payer: Self-pay | Admitting: Adult Health

## 2018-11-27 DIAGNOSIS — Z1231 Encounter for screening mammogram for malignant neoplasm of breast: Secondary | ICD-10-CM

## 2019-01-14 ENCOUNTER — Other Ambulatory Visit: Payer: Self-pay

## 2019-01-14 ENCOUNTER — Ambulatory Visit
Admission: RE | Admit: 2019-01-14 | Discharge: 2019-01-14 | Disposition: A | Discharge status: Home / Self Care | Payer: Medicare Other | Source: Ambulatory Visit | Attending: Obstetrics and Gynecology | Admitting: Obstetrics and Gynecology

## 2019-01-14 DIAGNOSIS — Z1231 Encounter for screening mammogram for malignant neoplasm of breast: Secondary | ICD-10-CM

## 2019-02-12 ENCOUNTER — Telehealth: Payer: Self-pay | Admitting: Internal Medicine

## 2019-02-12 NOTE — Telephone Encounter (Signed)
LM for patient that 02/18/2019 is a virtual clinic only. Asked that she call back to confirm OK with virtual visit (phone/video) or to r/s for first available in office appointment.   Appointment type changed

## 2019-02-18 ENCOUNTER — Encounter: Payer: Self-pay | Admitting: Internal Medicine

## 2019-02-18 ENCOUNTER — Telehealth (INDEPENDENT_AMBULATORY_CARE_PROVIDER_SITE_OTHER): Payer: Medicare Other | Admitting: Internal Medicine

## 2019-02-18 VITALS — BP 128/68 | HR 72 | Ht 65.0 in | Wt 142.0 lb

## 2019-02-18 DIAGNOSIS — I10 Essential (primary) hypertension: Secondary | ICD-10-CM | POA: Diagnosis not present

## 2019-02-18 DIAGNOSIS — I480 Paroxysmal atrial fibrillation: Secondary | ICD-10-CM | POA: Diagnosis not present

## 2019-02-18 DIAGNOSIS — E785 Hyperlipidemia, unspecified: Secondary | ICD-10-CM | POA: Diagnosis not present

## 2019-02-18 NOTE — Patient Instructions (Signed)
Medication Instructions:  Your physician recommends that you continue on your current medications as directed. Please refer to the Current Medication list given to you today.  *If you need a refill on your cardiac medications before your next appointment, please call your pharmacy*  Lab Work: NONE  Testing/Procedures: NONE  Follow-Up: At [facility], you and your health needs are our priority.  As part of our continuing mission to provide you with exceptional heart care, we have created designated Provider Care Teams.  These Care Teams include your primary Cardiologist (physician) and Advanced Practice Providers (APPs -  Physician Assistants and Nurse Practitioners) who all work together to provide you with the care you need, when you need it.  Your next appointment:   12 months  The format for your next appointment:   In Person  Provider:   K. Mali Hilty, MD  Other Instructions

## 2019-02-18 NOTE — Progress Notes (Signed)
Virtual Visit via Telephone Note   This visit type was conducted due to national recommendations for restrictions regarding the COVID-19 Pandemic (e.g. social distancing) in an effort to limit this patient's exposure and mitigate transmission in our community.  Due to her co-morbid illnesses, this patient is at least at moderate risk for complications without adequate follow up.  This format is felt to be most appropriate for this patient at this time.  The patient did not have access to video technology/had technical difficulties with video requiring transitioning to audio format only (telephone).  All issues noted in this document were discussed and addressed.  No physical exam could be performed with this format.  Please refer to the patient's chart for her  consent to telehealth for [facility].   Evaluation Performed:  Telephone visit  Date:  02/18/2019   ID:  Shelley Gutierrez, Shelley Gutierrez 04/10/1946, MRN [id number]  Patient Location:  [address] [place] [address]  Provider location:   [address] [place], [place] [address]  PCP:  Chesley Noon, MD  Cardiologist:  No primary care provider on file. Electrophysiologist:  None   Chief Complaint:  No complaints  History of Present Illness:    Shelley Gutierrez is a 73 y.o. female who presents via audio/video conferencing for a telehealth visit today.  Mrs. Atayde is seen today in follow-up.  Overall she is doing well.  She denies any recurrent symptomatic A. fib.  She denies bleeding difficulty on Xarelto although has struggled with some hemorrhoids.  Hemoglobin A1c is sad about 6.2 which is prediabetic.  Her cholesterol is stable with LDL between 110 and 120 on pravastatin.  Blood pressure is very well controlled.  She is actually lost about 8 to 10 pounds since I saw her last year and she attributes this to being more active with her grandson is been staying with her for about 6 months.  The patient does not have  symptoms concerning for COVID-19 infection (fever, chills, cough, or new SHORTNESS OF BREATH).    Prior CV studies:   The following studies were reviewed today:  Chart reviewed Labwork  PMHx:  Past Medical History:  Diagnosis Date  . Arthritis    Bursitis right shoulder  . Atypical chest pain 02/21/2011   Nuc, EF-83, Normal stress test  . Breast cancer ([place]) 03/03/2011   Right mastectomy- '96-surgery only for breast cancer  . Cyclical neutropenia ([place]) 03/03/2011  . Dyslipidemia   . GERD (gastroesophageal reflux disease)   . Hypercholesteremia   . Hypertension   . Pulsatile tinnitus 03/08/2012   Carotid doppler, mild amount of fibrous plaque, no evidence of significant diameter reduction    Past Surgical History:  Procedure Laterality Date  . BREAST EXCISIONAL BIOPSY Left   . BREAST RECONSTRUCTION  1996   Right Side  . BREAST RECONSTRUCTION  2010   Right Side  . CATARACT EXTRACTION, BILATERAL Bilateral   . COLONOSCOPY WITH PROPOFOL N/A 01/12/2015   Procedure: COLONOSCOPY WITH PROPOFOL;  Surgeon: Garlan Fair, MD;  [facility];  Service: Endoscopy;  Laterality: N/A;  . MASTECTOMY Right 1996    FAMHx:  Family History  Problem Relation Age of Onset  . Cancer Mother        Breast  . Cancer Father        Throat  . Cancer Maternal Grandmother        Pancreas  . Heart Problems Maternal Grandfather  Poor Circulation  . Cancer Brother   . Cancer Sister   . Graves' disease Sister     SOCHx:   reports that she has never smoked. She has never used smokeless tobacco. She reports that she does not drink alcohol or use drugs.  ALLERGIES:  Allergies  Allergen Reactions  . Penicillins Other (See Comments)    States it made her body smell when she took it Pt does not tolerate smell    MEDS:  Current Meds  Medication Sig  . CALCIUM PO Take by mouth daily.  . fluticasone (FLONASE) 50 MCG/ACT nasal spray as needed.  . hydrocortisone (ANUSOL-HC) 25  MG suppository Place rectally as needed.  Marland Kitchen losartan (COZAAR) 50 MG tablet Take 50 mg by mouth daily.  . metoprolol succinate (TOPROL-XL) 25 MG 24 hr tablet TAKE 1/2 TABLET (12.5 MG TOTAL) BY MOUTH DAILY. NEED OV.  . Multiple Vitamin (MULTIVITAMIN WITH MINERALS) TABS Take 1 tablet by mouth daily.  . pravastatin (PRAVACHOL) 40 MG tablet Take 40 mg by mouth daily.  . vitamin C (ASCORBIC ACID) 500 MG tablet Take 500 mg by mouth daily.  Alveda Reasons 20 MG TABS tablet TAKE 1 TABLET BY MOUTH EVERY DAY WITH SUPPER     ROS: Pertinent items noted in HPI and remainder of comprehensive ROS otherwise negative.  Labs/Other Tests and Data Reviewed:    Recent Labs: No results found for requested labs within last 8760 hours.   Recent Lipid Panel No results found for: CHOL, TRIG, HDL, CHOLHDL, LDLCALC, LDLDIRECT  Wt Readings from Last 3 Encounters:  02/18/19 142 lb (64.4 kg)  02/18/18 153 lb 12.8 oz (69.8 kg)  10/23/17 150 lb 11.2 oz (68.4 kg)     Exam:    Vital Signs:  BP 128/68   Pulse 72   Wt 142 lb (64.4 kg)   BMI 23.63 kg/m    Exam not performed due to telephone visit  ASSESSMENT & PLAN:    1. Palpitations - PAF noted on the monitor, asymptomatic (CHADSVASC score 3) - on Xarelto 2. Hypertension-controlled 3. Dyslipidemia-at goal 4. History of breast cancer status post right mastectomy  Ms. Suthard continues to do well without recurrent A. fib that is concerning.  She is on Xarelto without bleeding difficulty.  Recent labs showed no anemia.  Her blood pressure is well controlled.  Her cholesterols been stable on reasonably close to goal LDL less than 100.  She will continue to work on dietary changes to combat this as well as increased exercise.  No other changes to her medicines today.  COVID-19 Education: The signs and symptoms of COVID-19 were discussed with the patient and how to seek care for testing (follow up with PCP or arrange E-visit).  The importance of social distancing was  discussed today.  Patient Risk:   After full review of this patients clinical status, I feel that they are at least moderate risk at this time.  Time:   Today, I have spent 25 minutes with the patient with telehealth technology discussing afib, dyslipidemia, hypertension.     Medication Adjustments/Labs and Tests Ordered: Current medicines are reviewed at length with the patient today.  Concerns regarding medicines are outlined above.   Tests Ordered: No orders of the defined types were placed in this encounter.   Medication Changes: No orders of the defined types were placed in this encounter.   Disposition:  in 1 year(s)  Pixie Casino, MD, [place], [place]  Medical Director of the Advanced Lipid Disorders &  Cardiovascular Risk Reduction Clinic Diplomate of the American Board of Clinical Lipidology Attending Cardiologist  Direct Dial: [phone]  Fax: [phone]  Website:  [email or web address]  Pixie Casino, MD  02/18/2019 9:20 AM

## 2019-05-12 ENCOUNTER — Other Ambulatory Visit: Payer: Self-pay | Admitting: Internal Medicine

## 2019-05-12 DIAGNOSIS — I4891 Unspecified atrial fibrillation: Secondary | ICD-10-CM

## 2019-05-12 NOTE — Telephone Encounter (Signed)
73 F  69.8 kg, SCr 0.84 ([facility]),  CrCl 66.7, LOV Hilty 10/20

## 2019-06-03 ENCOUNTER — Other Ambulatory Visit: Payer: Self-pay | Admitting: Internal Medicine

## 2019-06-03 DIAGNOSIS — I4891 Unspecified atrial fibrillation: Secondary | ICD-10-CM

## 2019-08-18 ENCOUNTER — Telehealth: Payer: Self-pay | Admitting: Internal Medicine

## 2019-08-18 NOTE — Telephone Encounter (Signed)
Left voice mail to call back 

## 2019-08-18 NOTE — Telephone Encounter (Signed)
Patient with diagnosis of atrial fibrillation on Xarelto for anticoagulation.    Procedure: colonoscopy (for rectal bleeding) Date of procedure: 09/17/19  CHADS2-VASc score of  3 (HTN, AGE, female)  CrCl 66.7 Platelet count 197  Per office protocol, patient can hold Xarelto for 3 days prior to procedure.

## 2019-08-18 NOTE — Telephone Encounter (Signed)
Patient returning call.

## 2019-08-18 NOTE — Telephone Encounter (Signed)
   [facility] Pre-operative Risk Assessment    Request for surgical clearance:  1. What type of surgery is being performed? Colonoscopy (for rectal bleeding)  2. When is this surgery scheduled? 09/17/2019   3. What type of clearance is required (medical clearance vs. Pharmacy clearance to hold med vs. Both)? Both   4. Are there any medications that need to be held prior to surgery and how long? Xarelto - 3 days prior   5. Practice name and name of physician performing surgery? Dr. Therisa Doyne with [facility]   6. What is your office phone number [phone]    7.   What is your office fax number [phone]  8.   Anesthesia type (None, local, MAC, general) ? Propofol    Sheral Apley M 08/18/2019, 1:19 PM  _________________________________________________________________   (provider comments below)

## 2019-08-18 NOTE — Telephone Encounter (Signed)
   Chart reviewed as part of pre-operative protocol coverage. Given past medical history and time since last visit, based on ACC/AHA guidelines, Shelley Gutierrez would be at acceptable risk for the planned procedure without further cardiovascular testing.   I will route this recommendation to the requesting party via Epic fax function and remove from pre-op pool.  Please call with questions.  [place], [place] 08/18/2019, 4:36 PM

## 2019-11-07 ENCOUNTER — Other Ambulatory Visit: Payer: Self-pay | Admitting: Internal Medicine

## 2019-11-07 DIAGNOSIS — I4891 Unspecified atrial fibrillation: Secondary | ICD-10-CM

## 2019-11-08 NOTE — Telephone Encounter (Signed)
73 F 69.8 kg SCr 0.85 (4/21), LOV Hilty 01/2019  CrCl 65.9

## 2019-12-08 ENCOUNTER — Other Ambulatory Visit: Payer: Self-pay | Admitting: Obstetrics and Gynecology

## 2019-12-08 DIAGNOSIS — Z1231 Encounter for screening mammogram for malignant neoplasm of breast: Secondary | ICD-10-CM

## 2020-01-15 ENCOUNTER — Other Ambulatory Visit: Payer: Self-pay

## 2020-01-15 ENCOUNTER — Ambulatory Visit
Admission: RE | Admit: 2020-01-15 | Discharge: 2020-01-15 | Disposition: A | Discharge status: Home / Self Care | Payer: Medicare Other | Source: Ambulatory Visit | Attending: Obstetrics and Gynecology | Admitting: Obstetrics and Gynecology

## 2020-01-15 DIAGNOSIS — Z1231 Encounter for screening mammogram for malignant neoplasm of breast: Secondary | ICD-10-CM

## 2020-02-18 ENCOUNTER — Encounter: Payer: Self-pay | Admitting: Internal Medicine

## 2020-02-18 ENCOUNTER — Ambulatory Visit (INDEPENDENT_AMBULATORY_CARE_PROVIDER_SITE_OTHER): Payer: Medicare Other | Admitting: Internal Medicine

## 2020-02-18 ENCOUNTER — Other Ambulatory Visit: Payer: Self-pay

## 2020-02-18 VITALS — BP 142/88 | HR 73 | Ht 65.5 in | Wt 139.8 lb

## 2020-02-18 DIAGNOSIS — I10 Essential (primary) hypertension: Secondary | ICD-10-CM

## 2020-02-18 DIAGNOSIS — E785 Hyperlipidemia, unspecified: Secondary | ICD-10-CM

## 2020-02-18 DIAGNOSIS — I480 Paroxysmal atrial fibrillation: Secondary | ICD-10-CM

## 2020-02-18 NOTE — Progress Notes (Signed)
She was   OFFICE NOTE  Chief Complaint:  No complaints  Primary Care Physician: Chesley Noon, MD  HPI:  Shelley Gutierrez is a pleasant 74 year old female that I first saw in 2012, but has not followed up with me in over 4 years. She was referred to me but Marcene Brawn for evaluation of an abnormal EKG. At that time she had Q waves in V1 and V2 that was read as septal infarct. She was having some chest discomfort and was sent for evaluation of coronary disease. Her past medical history significant for hypertension and dyslipidemia as well as breast cancer status post right mastectomy. She underwent a nuclear stress test at that time which was negative for ischemia. She did exercise on the treadmill and achieved 7 METS of exercise and the study was interpreted as low risk. Since then she's had no further chest pain, however recently she's had the development of some palpitations. She has been under some stress and recently had some cold and URI symptoms for which she's been taking over-the-counter NyQuil with a decongestant. She also drinks some caffeine on a daily basis. She reports her palpitations are mostly worse at night and generally resolved by the morning. She denies any chest pain or shortness of breath. EKG today shows sinus rhythm.  [name] returns today for follow-up of her palpitations. She underwent monitoring but had some difficulty with the monitor. This was sent back to the company and she was re-fitted with a monitor. I did review her monitor results which showed episodes of paroxysmal atrial fibrillation which were fairly short-lived. Is not clear that she was symptomatic with these episodes. This patients CHA2DS2-VASc Score and unadjusted Ischemic Stroke Rate (% per year) is equal to 3.2 % stroke rate/year from a score of 3 (Above score calculated as 1 point each if present [CHF, HTN, DM, Vascular=MI/PAD/Aortic Plaque, Age if 65-74, or Female] Above score calculated as 2 points  each if present [Age > 75, or Stroke/TIA/TE]. I discussed the findings with her today and she had numerous questions for me. We spent greater than 30 minutes discussing the risks and benefits of anticoagulation, how anticoagulants work, why I believe she needs to be on something for rate control because of the possibility of recurrent A. fib and the fact that it may be associated with symptoms or significant tachycardia, etc. In addition, I asked Tommy Medal, PharmD, our anticoagulation pharmacist to talk with her about the risks and benefits of long-term anticoagulation. I propose today that she goes on to Xarelto 20 mg daily and discontinue aspirin. In addition I would recommend adding low-dose Toprol-XL 12.5 mg daily.  11/18/2015  [name] returns today for follow-up. She reports she's had no further palpitations. She seems to be tolerating Xarelto without any difficulty. In general her energy level is good. There is no adverse bleeding.  07/18/2016  [name] was seen today in follow-up. She denies any palpitations or recurrent A. fib. She's not had any difficulty with Xarelto. Blood pressure at home is between 235 and 361 systolic although was elevated slightly today.  01/25/2017  [name] returns today for follow-up. Overall she's done well. She denies any recurrent A. fib. EKG shows sinus rhythm today. She is maintained on Xarelto without any bleeding problems. Blood pressures been well controlled. She's also on pravastatin which is followed by her primary care provider.  02/18/2018  [name] returns today for follow-up.  She is done really well.  Her blood  pressure is good today.  She denies any A. fib.  She has been on Xarelto without any bleeding problems.  Cholesterol is followed by her PCP.   02/18/2020  [name] is seen today in follow-up.  She denies any recurrent atrial fibrillation.  She continues on Xarelto without any bleeding issues.  More recently she is noted a  small drop in her white blood cell count 3.3, just below the 3.4 lower and cut off.  She may get referred to hematology for further evaluation of this.  Also her cholesterols been higher.  She reports being compliant with pravastatin.  She says she does not think she has had any significant dietary changes or weight gain which is appropriate.  Her total cholesterol is now 222, triglycerides 81, HDL 68 and LDL 140.  EKG today shows normal sinus rhythm.  Overall she says she feels like she has been off somewhat more recently after her second COVID-19 vaccine.  PMHx:  Past Medical History:  Diagnosis Date  . Arthritis    Bursitis right shoulder  . Atypical chest pain 02/21/2011   Nuc, EF-83, Normal stress test  . Breast cancer ([facility]) 03/03/2011   Right mastectomy- '96-surgery only for breast cancer  . Cyclical neutropenia ([facility]) 03/03/2011  . Dyslipidemia   . GERD (gastroesophageal reflux disease)   . Hypercholesteremia   . Hypertension   . Pulsatile tinnitus 03/08/2012   Carotid doppler, mild amount of fibrous plaque, no evidence of significant diameter reduction    Past Surgical History:  Procedure Laterality Date  . BREAST EXCISIONAL BIOPSY Left   . BREAST RECONSTRUCTION  1996   Right Side  . BREAST RECONSTRUCTION  2010   Right Side  . CATARACT EXTRACTION, BILATERAL Bilateral   . COLONOSCOPY WITH PROPOFOL N/A 01/12/2015   Procedure: COLONOSCOPY WITH PROPOFOL;  Surgeon: Garlan Fair, MD;  Location: [facility];  Service: Endoscopy;  Laterality: N/A;  . MASTECTOMY Right 1996    FAMHx:  Family History  Problem Relation Age of Onset  . Cancer Mother        Breast  . Cancer Father        Throat  . Cancer Maternal Grandmother        Pancreas  . Heart Problems Maternal Grandfather        Poor Circulation  . Cancer Brother   . Cancer Sister   . Graves' disease Sister     SOCHx:   reports that she has never smoked. She has never used smokeless tobacco. She reports that she  does not drink alcohol and does not use drugs.  ALLERGIES:  Allergies  Allergen Reactions  . Penicillins Other (See Comments)    Pt states that she does not like how Penicillin make her smell and only to be given in emergencies.    ROS: Pertinent items noted in HPI and remainder of comprehensive ROS otherwise negative.  HOME MEDS: Current Outpatient Medications  Medication Sig Dispense Refill  . CALCIUM PO Take by mouth daily.    . fluticasone (FLONASE) 50 MCG/ACT nasal spray as needed.    Marland Kitchen losartan (COZAAR) 50 MG tablet Take 50 mg by mouth daily.  3  . metoprolol succinate (TOPROL-XL) 25 MG 24 hr tablet Take 0.5 tablets (12.5 mg total) by mouth daily. 45 tablet 2  . Multiple Vitamin (MULTIVITAMIN WITH MINERALS) TABS Take 1 tablet by mouth daily.    . pravastatin (PRAVACHOL) 40 MG tablet Take 40 mg by mouth daily.    Marland Kitchen  vitamin C (ASCORBIC ACID) 500 MG tablet Take 500 mg by mouth daily.    Alveda Reasons 20 MG TABS tablet TAKE 1 TABLET BY MOUTH EVERY DAY WITH SUPPER 90 tablet 1   No current facility-administered medications for this visit.    LABS/IMAGING: No results found for this or any previous visit (from the past 48 hour(s)). No results found.  WEIGHTS: Wt Readings from Last 3 Encounters:  02/18/20 139 lb 12.8 oz (63.4 kg)  02/18/19 142 lb (64.4 kg)  02/18/18 153 lb 12.8 oz (69.8 kg)    VITALS: BP (!) 142/88   Pulse 73   Ht 5' 5.5" (1.664 m)   Wt 139 lb 12.8 oz (63.4 kg)   SpO2 99%   BMI 22.91 kg/m   EXAM: General appearance: alert and no distress Neck: no carotid bruit and no JVD Lungs: clear to auscultation bilaterally Heart: regular rate and rhythm, S1, S2 normal, no murmur, click, rub or gallop Abdomen: soft, non-tender; bowel sounds normal; no masses,  no organomegaly Extremities: extremities normal, atraumatic, no cyanosis or edema Pulses: 2+ and symmetric Skin: Skin color, texture, turgor normal. No rashes or lesions Neurologic: Grossly  normal  EKG: Normal sinus rhythm at 73, low voltage QRS -personally reviewed  ASSESSMENT: 1. Palpitations - PAF noted on the monitor, asymptomatic (CHADSVASC score 3) - on Xarelto 2. Hypertension-controlled 3. Dyslipidemia-at goal 4. History of breast cancer status post right mastectomy  PLAN: 1.   Mrs. Huberty denies any recurrent atrial fibrillation.  She is doing well on Xarelto.  No evidence of anemia although recently a low white blood cell count.  She is following up with her PCP on this and may need to see hematology.  Her blood pressure is well controlled at home although elevated in the office.  Cholesterol is up with LDL 140.  She says she has not changed her diet and still takes her pravastatin.  We talked about ways to lower that further and she should have repeat testing through her PCP which is already scheduled in 3 months.  I advised that she may wish to ask her PCP to check her thyroid to make sure that not playing a role.  I am a little concerned about her low white blood cell count given her history of breast cancer.  She is also felt fatigued and this might be worrisome for some type of a bone marrow process.  Follow-up in 6 months or sooner as necessary.   Pixie Casino, MD, [facility], [place] Director of the Advanced Lipid Disorders &  Cardiovascular Risk Reduction Clinic Diplomate of the American Board of Clinical Lipidology Attending Cardiologist  Direct Dial: [phone]  Fax: [phone]  Website:  [email or web address] 02/18/2020, 3:09 PM

## 2020-02-18 NOTE — Patient Instructions (Signed)
Medication Instructions:  Your physician recommends that you continue on your current medications as directed. Please refer to the Current Medication list given to you today.  *If you need a refill on your cardiac medications before your next appointment, please call your pharmacy*   Follow-Up: At [facility], you and your health needs are our priority.  As part of our continuing mission to provide you with exceptional heart care, we have created designated Provider Care Teams.  These Care Teams include your primary Cardiologist (physician) and Advanced Practice Providers (APPs -  Physician Assistants and Nurse Practitioners) who all work together to provide you with the care you need, when you need it.  We recommend signing up for the patient portal called "MyChart".  Sign up information is provided on this After Visit Summary.  MyChart is used to connect with patients for Virtual Visits (Telemedicine).  Patients are able to view lab/test results, encounter notes, upcoming appointments, etc.  Non-urgent messages can be sent to your provider as well.   To learn more about what you can do with MyChart, go to [email or web address].    Your next appointment:   6 month(s)  The format for your next appointment:   In Person  Provider:   You may see Dr. Hilty or one of the following Advanced Practice Providers on your designated Care Team:    Hao Meng, PA-C  Angela Duke, PA-C or   Krista Kroeger, PA-C    Other Instructions   

## 2020-02-26 ENCOUNTER — Other Ambulatory Visit: Payer: Self-pay | Admitting: Internal Medicine

## 2020-02-26 DIAGNOSIS — I4891 Unspecified atrial fibrillation: Secondary | ICD-10-CM

## 2020-03-08 ENCOUNTER — Encounter: Payer: Self-pay | Admitting: Genetic Counselor

## 2020-03-16 ENCOUNTER — Encounter: Payer: Self-pay | Admitting: Genetic Counselor

## 2020-04-13 ENCOUNTER — Encounter: Payer: Self-pay | Admitting: Genetic Counselor

## 2020-04-13 ENCOUNTER — Other Ambulatory Visit: Payer: Self-pay

## 2020-04-13 ENCOUNTER — Inpatient Hospital Stay: Payer: Medicare Other

## 2020-04-13 ENCOUNTER — Inpatient Hospital Stay: Payer: Medicare Other | Attending: Genetic Counselor | Admitting: Genetic Counselor

## 2020-04-13 DIAGNOSIS — Z803 Family history of malignant neoplasm of breast: Secondary | ICD-10-CM | POA: Insufficient documentation

## 2020-04-13 DIAGNOSIS — C50411 Malignant neoplasm of upper-outer quadrant of right female breast: Secondary | ICD-10-CM

## 2020-04-13 DIAGNOSIS — Z800 Family history of malignant neoplasm of digestive organs: Secondary | ICD-10-CM

## 2020-04-14 ENCOUNTER — Encounter: Payer: Self-pay | Admitting: Genetic Counselor

## 2020-04-14 NOTE — Progress Notes (Signed)
REFERRING PROVIDER: Badger, Michael C, MD [address] [place],  [place] [address]  PRIMARY PROVIDER:  Badger, Michael C, MD  PRIMARY REASON FOR VISIT:  1. Malignant neoplasm of upper-outer quadrant of right female breast, unspecified estrogen receptor status (HCC)   2. Family history of breast cancer   3. Family history of pancreatic cancer   4. Family history of throat cancer      HISTORY OF PRESENT ILLNESS:   [name], a 74 y.o. female, was seen for a [facility] cancer genetics consultation due to a personal and family history of cancer.  [name] presents to clinic today to discuss the possibility of a hereditary predisposition to cancer, genetic testing, and to further clarify her future cancer risks, as well as potential cancer risks for family members.   In 1996, at the age of 48, [name] was diagnosed with right breast cancer. The treatment plan included a right mastectomy.   RISK FACTORS:  Menarche was at age 15.  First live birth at age 26-27.  OCP use for approximately 22 years.  Ovaries intact: yes.  Hysterectomy: no.  Menopausal status: postmenopausal.  HRT use: yes - stopped when diagnosed with cancer. Colonoscopy: yes; 06/2019 - 1 precancerous polyp. Mammogram within the last year: yes. Any excessive radiation exposure in the past: no   Past Medical History:  Diagnosis Date  . Arthritis    Bursitis right shoulder  . Atypical chest pain 02/21/2011   Nuc, EF-83, Normal stress test  . Breast cancer (H[facility]) 03/03/2011   Right mastectomy- '96-surgery only for breast cancer  . Cyclical neutropenia ([facility]) 03/03/2011  . Dyslipidemia   . Family history of breast cancer   . Family history of pancreatic cancer   . Family history of throat cancer   . GERD (gastroesophageal reflux disease)   . Hypercholesteremia   . Hypertension   . Pulsatile tinnitus 03/08/2012   Carotid doppler, mild amount of fibrous plaque, no evidence of significant diameter reduction     Past Surgical History:  Procedure Laterality Date  . BREAST EXCISIONAL BIOPSY Left   . BREAST RECONSTRUCTION  1996   Right Side  . BREAST RECONSTRUCTION  2010   Right Side  . CATARACT EXTRACTION, BILATERAL Bilateral   . COLONOSCOPY WITH PROPOFOL N/A 01/12/2015   Procedure: COLONOSCOPY WITH PROPOFOL;  Surgeon: Martin K Johnson, MD;  Location: [facility];  Service: Endoscopy;  Laterality: N/A;  . MASTECTOMY Right 1996    Social History   Socioeconomic History  . Marital status: Married    Spouse name: Not on file  . Number of children: Not on file  . Years of education: Not on file  . Highest education level: Not on file  Occupational History  . Not on file  Tobacco Use  . Smoking status: Never Smoker  . Smokeless tobacco: Never Used  Substance and Sexual Activity  . Alcohol use: No  . Drug use: No  . Sexual activity: Not Currently  Other Topics Concern  . Not on file  Social History Narrative   Epworth Sleepiness Scale         Score Total: 8         --I have high blood pressure      --I wake up to urinate frequently at night      --I have been told that I snore   Social Determinants of Health   Financial Resource Strain: Not on file  Food Insecurity: Not on file  Transportation   Needs: Not on file  Physical Activity: Not on file  Stress: Not on file  Social Connections: Not on file     FAMILY HISTORY:  We obtained a detailed, 4-generation family history.  Significant diagnoses are listed below: Family History  Problem Relation Age of Onset  . Breast cancer Mother        dx early 68s  . Throat cancer Father 77       heavy smoker  . Pancreatic cancer Maternal Grandmother 16  . Diabetes Maternal Grandmother   . Heart Problems Maternal Grandfather        Poor Circulation  . Cancer Half-Brother        unknown type, heavy smoker  . Cancer Half-Sister        unknown type, heavy smoker  . Graves' disease Half-Sister   . Breast cancer Maternal Aunt  85  . Aneurysm Maternal Uncle   . Cancer Maternal Aunt 73       unknown type, smoker  . Pneumonia Maternal Uncle    [name] has one daughter (age 32) who has not had cancer but has genetic testing in progress. [name] has two paternal half-sisters and two paternal half-brothers. One sister died at age 58 from cancer (type unknown to patient) and was a heavy smoker. One brother died at age 45 from cancer (type unknown to patient) and was also a heavy smoker.   [name] mother died at age 76 and had breast cancer diagnosed in her early 10s. [name] had two maternal aunts and two maternal uncles. One aunt had breast cancer diagnosed at age 16. The other aunt died from cancer (type unknown to patient) that was first diagnosed at age 38. Her maternal grandmother died at age 19 from pancreatic cancer and had a history of multiple health problems, including diabetes and a stomach illness diagnosed in her 38s (unknown if this was cancer). [name] maternal grandfather died at age 36 and did not have cancer.  [name] father died at age 55 from throat cancer and was a heavy smoker. She had one paternal uncle (identical twin to her father) that she knows of, and she heard there were paternal aunts but does not have any information on these relatives. She does not have any information about her paternal grandparents.  [name] is unaware of previous family history of genetic testing for hereditary cancer risks. Patient's maternal ancestors are of [place] American and mixed descent, and paternal ancestors are of [place] American descent. There is no reported Ashkenazi Jewish ancestry. There is no known consanguinity.  GENETIC COUNSELING ASSESSMENT: Ms. Scriven is a 74 y.o. female with a personal history of breast cancer and a family history of breast cancer and pancreatic cancer, which is somewhat suggestive of a hereditary cancer syndrome and predisposition to cancer. We, therefore,  discussed and recommended the following at today's visit.   DISCUSSION: We discussed that approximately 5-10% of breast cancer is hereditary, with most cases associated with the BRCA1 and BRCA2 genes. There are other genes that can be associated with hereditary breast cancer syndromes. These include ATM, CHEK2, PALB2, etc. We discussed that testing is beneficial for several reasons, including knowing about other cancer risks, identifying potential screening and risk-reduction options that may be appropriate, and to understand if other family members could be at risk for cancer and allow them to undergo genetic testing.   We reviewed the characteristics, features and inheritance patterns of hereditary cancer syndromes. We also discussed  genetic testing, including the appropriate family members to test, the process of testing, insurance coverage and turn-around-time for results. We discussed the implications of a negative, positive and/or variant of uncertain significant result. We recommended Ms. Jerrett pursue genetic testing for the [facility] CancerNext-Expanded + RNAinsight gene panel.   The CancerNext-Expanded + RNAinsight gene panel offered by [facility] and includes sequencing and rearrangement analysis for the following 77 genes: AIP, ALK, APC, ATM, AXIN2, BAP1, BARD1, BLM, BMPR1A, BRCA1, BRCA2, BRIP1, CDC73, CDH1, CDK4, CDKN1B, CDKN2A, CHEK2, CTNNA1, DICER1, FANCC, FH, FLCN, GALNT12, KIF1B, LZTR1, MAX, MEN1, MET, MLH1, MSH2, MSH3, MSH6, MUTYH, NBN, NF1, NF2, NTHL1, PALB2, PHOX2B, PMS2, POT1, PRKAR1A, PTCH1, PTEN, RAD51C, RAD51D, RB1, RECQL, RET, SDHA, SDHAF2, SDHB, SDHC, SDHD, SMAD4, SMARCA4, SMARCB1, SMARCE1, STK11, SUFU, TMEM127, TP53, TSC1, TSC2, VHL and XRCC2 (sequencing and deletion/duplication); EGFR, EGLN1, HOXB13, KIT, MITF, PDGFRA, POLD1 and POLE (sequencing only); EPCAM and GREM1 (deletion/duplication only). RNA data is routinely analyzed for use in variant interpretation for all genes.    Based on Ms. Vipond's personal and family history of cancer, she meets medical criteria for genetic testing. Despite that she meets criteria, there may still be an out of pocket cost. We discussed that if her out of pocket cost for testing is over $100, the laboratory will reach out to let her know. If the out of pocket cost of testing is less than $100 she will be billed by the genetic testing laboratory.    PLAN: After considering the risks, benefits, and limitations, Ms. Santistevan provided informed consent to pursue genetic testing and the blood sample was sent to [facility] for analysis of the CancerNext-Expanded + RNAinsight panel. Results should be available within approximately two-three weeks' time, at which point they will be disclosed by telephone to Ms. Seliga, as will any additional recommendations warranted by these results. Ms. Borba will receive a summary of her genetic counseling visit and a copy of her results once available. This information will also be available in Epic.   Ms. Dibella's questions were answered to her satisfaction today. Our contact information was provided should additional questions or concerns arise. Thank you for the referral and allowing us to share in the care of your patient.   Emily Stiglich, MS, LCGC Licensed, Certified Genetic Counselor [email or web address] Phone: [phone]  The patient was seen for a total of 40 minutes in face-to-face genetic counseling.  This patient was discussed with Drs. Magrinat, Gudena and/or Feng who agrees with the above.    _______________________________________________________________________ For Office Staff:  Number of people involved in session: 1 Was an Intern/ student involved with case: no 

## 2020-05-03 DIAGNOSIS — Z1379 Encounter for other screening for genetic and chromosomal anomalies: Secondary | ICD-10-CM | POA: Insufficient documentation

## 2020-05-06 ENCOUNTER — Ambulatory Visit: Payer: Self-pay | Admitting: Genetic Counselor

## 2020-05-06 ENCOUNTER — Telehealth: Payer: Self-pay | Admitting: Genetic Counselor

## 2020-05-06 ENCOUNTER — Encounter: Payer: Self-pay | Admitting: Genetic Counselor

## 2020-05-06 DIAGNOSIS — Z1379 Encounter for other screening for genetic and chromosomal anomalies: Secondary | ICD-10-CM

## 2020-05-06 NOTE — Telephone Encounter (Signed)
Revealed negative genetic testing. Discussed that we do not know why she had breast cancer or why there is cancer in the family. It is possible that there could be a mutation in a different gene that we are not testing, or our current technology may not be able detect certain mutations. It will therefore be important for her to stay in contact with genetics to keep up with whether additional testing may be appropriate in the future.   Two variants of uncertain significance were detected - one in the GALNT12 gene called 5'UTR_EX7del and a second in the PTCH1 gene called p.G470V. Her result is still considered normal at this time and should not impact her medical management.

## 2020-05-06 NOTE — Progress Notes (Signed)
HPI:  Ms. Parish was previously seen in the [facility] clinic due to a personal and family history of cancer and concerns regarding a hereditary predisposition to cancer. Please refer to our prior cancer genetics clinic note for more information regarding our discussion, assessment and recommendations, at the time. Ms. Magana recent genetic test results were disclosed to her, as were recommendations warranted by these results. These results and recommendations are discussed in more detail below.  FAMILY HISTORY:  We obtained a detailed, 4-generation family history.  Significant diagnoses are listed below: Family History  Problem Relation Age of Onset  . Breast cancer Mother        dx early 74s  . Throat cancer Father 59       heavy smoker  . Pancreatic cancer Maternal Grandmother 36  . Diabetes Maternal Grandmother   . Heart Problems Maternal Grandfather        Poor Circulation  . Cancer Half-Brother        unknown type, heavy smoker  . Cancer Half-Sister        unknown type, heavy smoker  . Graves' disease Half-Sister   . Breast cancer Maternal Aunt 85  . Aneurysm Maternal Uncle   . Cancer Maternal Aunt 73       unknown type, smoker  . Pneumonia Maternal Uncle    Ms. Tooley has one daughter (age 27) who has not had cancer but had negative genetic testing. Ms. Rosero has two paternal half-sisters and two paternal half-brothers. One sister died at age 46 from cancer (type unknown to patient) and was a heavy smoker. One brother died at age 68 from cancer (type unknown to patient) and was also a heavy smoker.   Ms. Gosdin mother died at age 3 and had breast cancer diagnosed in her early 17s. Ms. Lupi had two maternal aunts and two maternal uncles. One aunt had breast cancer diagnosed at age 38. The other aunt died from cancer (type unknown to patient) that was first diagnosed at age 68. Her maternal grandmother died at age 68 from pancreatic cancer and had a history  of multiple health problems, including diabetes and a stomach illness diagnosed in her 40s (unknown if this was cancer). Ms. Shampine maternal grandfather died at age 44 and did not have cancer.  Ms. Bonneville father died at age 33 from throat cancer and was a heavy smoker. She had one paternal uncle (identical twin to her father) that she knows of, and she heard there were paternal aunts but does not have any information on these relatives. She does not have any information about her paternal grandparents.  Ms. Port is aware of previous family history of genetic testing for hereditary cancer risks. Patient's maternal ancestors are of [place] American and mixed descent, and paternal ancestors are of [place] American descent. There is no reported Ashkenazi Jewish ancestry. There is no known consanguinity.  GENETIC TEST RESULTS: Genetic testing reported out on 05/03/2020 through the [facility] CancerNext-Expanded + RNAinsight panel. No pathogenic variants were detected.   The CancerNext-Expanded + RNAinsight gene panel offered by [facility] and includes sequencing and rearrangement analysis for the following 77 genes: AIP, ALK, APC, ATM, AXIN2, BAP1, BARD1, BLM, BMPR1A, BRCA1, BRCA2, BRIP1, CDC73, CDH1, CDK4, CDKN1B, CDKN2A, CHEK2, CTNNA1, DICER1, FANCC, FH, FLCN, GALNT12, KIF1B, LZTR1, MAX, MEN1, MET, MLH1, MSH2, MSH3, MSH6, MUTYH, NBN, NF1, NF2, NTHL1, PALB2, PHOX2B, PMS2, POT1, PRKAR1A, PTCH1, PTEN, RAD51C, RAD51D, RB1, RECQL, RET, SDHA, SDHAF2, SDHB, SDHC, SDHD, SMAD4, SMARCA4,  SMARCB1, SMARCE1, STK11, SUFU, TMEM127, TP53, TSC1, TSC2, VHL and XRCC2 (sequencing and deletion/duplication); EGFR, EGLN1, HOXB13, KIT, MITF, PDGFRA, POLD1 and POLE (sequencing only); EPCAM and GREM1 (deletion/duplication only). RNA data is routinely analyzed for use in variant interpretation for all genes. The test report will be scanned into EPIC and located under the Molecular Pathology section of the Results Review tab.  A  portion of the result report is included below for reference.     We discussed with Ms. Hollinger that because current genetic testing is not perfect, it is possible there may be a gene mutation in one of these genes that current testing cannot detect, but that chance is small.  We also discussed that there could be another gene that has not yet been discovered, or that we have not yet tested, that is responsible for the cancer diagnoses in the family. It is also possible there is a hereditary cause for the cancer in the family that Ms. Pritts did not inherit and therefore was not identified in her testing.  Therefore, it is important to remain in touch with cancer genetics in the future so that we can continue to offer Ms. Ruis the most up to date genetic testing.   Genetic testing did identify two variants of uncertain significance (VUS) - one in the GALNT12 gene called 5'UTR_EX7del and a second in the PTCH1 gene called p.G470V. At this time, it is unknown if these variants are associated with increased cancer risk or if they are normal findings, but most variants such as these get reclassified to being inconsequential. They should not be used to make medical management decisions. With time, we suspect the lab will determine the significance of these variants, if any. If we do learn more about them, we will try to contact Ms. Lefever to discuss it further. However, it is important to stay in touch with [place] periodically and keep the address and phone number up to date.  CANCER SCREENING RECOMMENDATIONS: Ms. Licklider test result is considered negative (normal).  This means that we have not identified a hereditary cause for her personal and family history of cancer at this time. While reassuring, this does not definitively rule out a hereditary predisposition to cancer. It is still possible that there could be genetic mutations that are undetectable by current technology. There could be genetic mutations in  genes that have not been tested or identified to increase cancer risk.  Therefore, it is recommended she continue to follow the cancer management and screening guidelines provided by her oncology and primary healthcare providers.   An individual's cancer risk and medical management are not determined by genetic test results alone. Overall cancer risk assessment incorporates additional factors, including personal medical history, family history, and any available genetic information that may result in a personalized plan for cancer prevention and surveillance.  RECOMMENDATIONS FOR FAMILY MEMBERS:  Individuals in this family might be at some increased risk of developing cancer, over the general population risk, simply due to the family history of cancer.  We recommended women in this family have a yearly mammogram beginning at age 58, or 81 years younger than the earliest onset of cancer, an annual clinical breast exam, and perform monthly breast self-exams. Women in this family should also have a gynecological exam as recommended by their primary provider. All family members should be referred for colonoscopy starting at age 45.  FOLLOW-UP: Lastly, we discussed with Ms. Legrand that cancer genetics is a rapidly advancing field and it  is possible that new genetic tests will be appropriate for her and/or her family members in the future. We encouraged her to remain in contact with cancer genetics on an annual basis so we can update her personal and family histories and let her know of advances in cancer genetics that may benefit this family.   Our contact number was provided. Ms. Galluzzo questions were answered to her satisfaction, and she knows she is welcome to call us at anytime with additional questions or concerns.   A copy of her results will be sent to her daughter's doctor, per patient request.   Clint Guy, MS, [email or web address] Phone: [phone]

## 2020-05-09 ENCOUNTER — Other Ambulatory Visit: Payer: Self-pay | Admitting: Internal Medicine

## 2020-05-09 DIAGNOSIS — I4891 Unspecified atrial fibrillation: Secondary | ICD-10-CM

## 2020-05-14 ENCOUNTER — Encounter: Payer: Self-pay | Admitting: Genetic Counselor

## 2020-07-30 ENCOUNTER — Other Ambulatory Visit: Payer: Self-pay | Admitting: Obstetrics and Gynecology

## 2020-07-30 DIAGNOSIS — E2839 Other primary ovarian failure: Secondary | ICD-10-CM

## 2020-08-05 ENCOUNTER — Ambulatory Visit (INDEPENDENT_AMBULATORY_CARE_PROVIDER_SITE_OTHER): Payer: Medicare Other | Admitting: Internal Medicine

## 2020-08-05 ENCOUNTER — Other Ambulatory Visit: Payer: Self-pay

## 2020-08-05 VITALS — BP 126/86 | HR 75 | Ht 65.0 in | Wt 136.0 lb

## 2020-08-05 DIAGNOSIS — I480 Paroxysmal atrial fibrillation: Secondary | ICD-10-CM | POA: Diagnosis not present

## 2020-08-05 DIAGNOSIS — E785 Hyperlipidemia, unspecified: Secondary | ICD-10-CM | POA: Diagnosis not present

## 2020-08-05 DIAGNOSIS — I10 Essential (primary) hypertension: Secondary | ICD-10-CM

## 2020-08-05 NOTE — Patient Instructions (Signed)

## 2020-08-05 NOTE — Progress Notes (Signed)
She was   OFFICE NOTE  Chief Complaint:  No complaints  Primary Care Physician: Chesley Noon, MD  HPI:  Shelley Gutierrez is a pleasant 75 year old female that I first saw in 2012, but has not followed up with me in over 4 years. She was referred to me but Marcene Brawn for evaluation of an abnormal EKG. At that time she had Q waves in V1 and V2 that was read as septal infarct. She was having some chest discomfort and was sent for evaluation of coronary disease. Her past medical history significant for hypertension and dyslipidemia as well as breast cancer status post right mastectomy. She underwent a nuclear stress test at that time which was negative for ischemia. She did exercise on the treadmill and achieved 7 METS of exercise and the study was interpreted as low risk. Since then she's had no further chest pain, however recently she's had the development of some palpitations. She has been under some stress and recently had some cold and URI symptoms for which she's been taking over-the-counter NyQuil with a decongestant. She also drinks some caffeine on a daily basis. She reports her palpitations are mostly worse at night and generally resolved by the morning. She denies any chest pain or shortness of breath. EKG today shows sinus rhythm.  [name] returns today for follow-up of her palpitations. She underwent monitoring but had some difficulty with the monitor. This was sent back to the company and she was re-fitted with a monitor. I did review her monitor results which showed episodes of paroxysmal atrial fibrillation which were fairly short-lived. Is not clear that she was symptomatic with these episodes. This patients CHA2DS2-VASc Score and unadjusted Ischemic Stroke Rate (% per year) is equal to 3.2 % stroke rate/year from a score of 3 (Above score calculated as 1 point each if present [CHF, HTN, DM, Vascular=MI/PAD/Aortic Plaque, Age if 65-74, or Female] Above score calculated as 2 points  each if present [Age > 75, or Stroke/TIA/TE]. I discussed the findings with her today and she had numerous questions for me. We spent greater than 30 minutes discussing the risks and benefits of anticoagulation, how anticoagulants work, why I believe she needs to be on something for rate control because of the possibility of recurrent A. fib and the fact that it may be associated with symptoms or significant tachycardia, etc. In addition, I asked Tommy Medal, PharmD, our anticoagulation pharmacist to talk with her about the risks and benefits of long-term anticoagulation. I propose today that she goes on to Xarelto 20 mg daily and discontinue aspirin. In addition I would recommend adding low-dose Toprol-XL 12.5 mg daily.  11/18/2015  [name] returns today for follow-up. She reports she's had no further palpitations. She seems to be tolerating Xarelto without any difficulty. In general her energy level is good. There is no adverse bleeding.  07/18/2016  [name] was seen today in follow-up. She denies any palpitations or recurrent A. fib. She's not had any difficulty with Xarelto. Blood pressure at home is between 235 and 361 systolic although was elevated slightly today.  01/25/2017  [name] returns today for follow-up. Overall she's done well. She denies any recurrent A. fib. EKG shows sinus rhythm today. She is maintained on Xarelto without any bleeding problems. Blood pressures been well controlled. She's also on pravastatin which is followed by her primary care provider.  02/18/2018  [name] returns today for follow-up.  She is done really well.  Her blood  pressure is good today.  She denies any A. fib.  She has been on Xarelto without any bleeding problems.  Cholesterol is followed by her PCP.   02/18/2020  Mrs. Cataldo is seen today in follow-up.  She denies any recurrent atrial fibrillation.  She continues on Xarelto without any bleeding issues.  More recently she is noted a  small drop in her white blood cell count 3.3, just below the 3.4 lower and cut off.  She may get referred to hematology for further evaluation of this.  Also her cholesterols been higher.  She reports being compliant with pravastatin.  She says she does not think she has had any significant dietary changes or weight gain which is appropriate.  Her total cholesterol is now 222, triglycerides 81, HDL 68 and LDL 140.  EKG today shows normal sinus rhythm.  Overall she says she feels like she has been off somewhat more recently after her second COVID-19 vaccine.   08/05/2020  Mrs. Pautler is seen today in follow-up.  Overall she is feeling well and is quite cheerful today.  Her recent labs do show however persistently elevated cholesterol with total 215, triglycerides 84, HDL 65 and LDL 135.  This was discussed with Dr. Melford Aase who she also saw earlier this morning and she is working on dietary changes.  Weight is appropriate and blood pressure is well controlled.  EKG shows normal sinus rhythm.  She denies any significant bleeding issues on Xarelto although gets some occasional nosebleeds.  She denies any recurrent atrial fibrillation.  PMHx:  Past Medical History:  Diagnosis Date  . Arthritis    Bursitis right shoulder  . Atypical chest pain 02/21/2011   Nuc, EF-83, Normal stress test  . Breast cancer ([place]) 03/03/2011   Right mastectomy- '96-surgery only for breast cancer  . 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 Dyslipidemia   . Family history of breast cancer   . Family history of pancreatic cancer   . Family history of throat cancer   . GERD (gastroesophageal reflux disease)   . Hypercholesteremia   . Hypertension   . Pulsatile tinnitus 03/08/2012   Carotid doppler, mild amount of fibrous plaque, no evidence of significant diameter reduction    Past Surgical History:  Procedure Laterality Date  . BREAST EXCISIONAL BIOPSY Left   . BREAST RECONSTRUCTION  1996   Right Side  . BREAST  RECONSTRUCTION  2010   Right Side  . CATARACT EXTRACTION, BILATERAL Bilateral   . COLONOSCOPY WITH PROPOFOL N/A 01/12/2015   Procedure: COLONOSCOPY WITH PROPOFOL;  Surgeon: Garlan Fair, MD;  Location: [facility] ENDOSCOPY;  Service: Endoscopy;  Laterality: N/A;  . MASTECTOMY Right 1996    FAMHx:  Family History  Problem Relation Age of Onset  . Breast cancer Mother        dx early 1s  . Throat cancer Father 48       heavy smoker  . Pancreatic cancer Maternal Grandmother 45  . Diabetes Maternal Grandmother   . Heart Problems Maternal Grandfather        Poor Circulation  . Cancer Half-Brother        unknown type, heavy smoker  . Cancer Half-Sister        unknown type, heavy smoker  . Graves' disease Half-Sister   . Breast cancer Maternal Aunt 85  . Aneurysm Maternal Uncle   . Cancer Maternal Aunt 73       unknown type, smoker  . Pneumonia Maternal Uncle  SOCHx:   reports that she has never smoked. She has never used smokeless tobacco. She reports that she does not drink alcohol and does not use drugs.  ALLERGIES:  Allergies  Allergen Reactions  . Penicillins Other (See Comments)    Pt states that she does not like how Penicillin make her smell and only to be given in emergencies.    ROS: Pertinent items noted in HPI and remainder of comprehensive ROS otherwise negative.  HOME MEDS: Current Outpatient Medications  Medication Sig Dispense Refill  . CALCIUM PO Take by mouth daily.    . fluticasone (FLONASE) 50 MCG/ACT nasal spray as needed.    Marland Kitchen losartan (COZAAR) 50 MG tablet Take 50 mg by mouth daily.  3  . metoprolol succinate (TOPROL-XL) 25 MG 24 hr tablet TAKE 1/2 TABLET BY MOUTH EVERY DAY 45 tablet 2  . Multiple Vitamin (MULTIVITAMIN WITH MINERALS) TABS Take 1 tablet by mouth daily.    . pravastatin (PRAVACHOL) 40 MG tablet Take 40 mg by mouth daily.    . vitamin C (ASCORBIC ACID) 500 MG tablet Take 500 mg by mouth daily.    Alveda Reasons 20 MG TABS tablet TAKE 1  TABLET BY MOUTH EVERY DAY WITH SUPPER 90 tablet 1   No current facility-administered medications for this visit.    LABS/IMAGING: No results found for this or any previous visit (from the past 48 hour(s)). No results found.  WEIGHTS: Wt Readings from Last 3 Encounters:  08/05/20 136 lb (61.7 kg)  02/18/20 139 lb 12.8 oz (63.4 kg)  02/18/19 142 lb (64.4 kg)    VITALS: BP 126/86   Pulse 75   Ht 5\' 5"  (1.651 m)   Wt 136 lb (61.7 kg)   SpO2 97%   BMI 22.63 kg/m   EXAM: General appearance: alert and no distress Neck: no carotid bruit and no JVD Lungs: clear to auscultation bilaterally Heart: regular rate and rhythm, S1, S2 normal, no murmur, click, rub or gallop Abdomen: soft, non-tender; bowel sounds normal; no masses,  no organomegaly Extremities: extremities normal, atraumatic, no cyanosis or edema Pulses: 2+ and symmetric Skin: Skin color, texture, turgor normal. No rashes or lesions Neurologic: Grossly normal  EKG: Normal sinus rhythm at 76-personally reviewed-personally reviewed  ASSESSMENT: 1. Palpitations - PAF noted on the monitor, asymptomatic (CHADSVASC score 3) - on Xarelto 2. Hypertension 3. Dyslipidemia 4. History of breast cancer status post right mastectomy  PLAN: 1.   Mrs. Leech seems to be doing well.  She denies any chest pain or worsening shortness of breath.  There is no recurrent A. fib.  She is tolerating Xarelto.  Her cholesterol is somewhat elevated.  She will continue to work on that but may need to consider adding ezetimibe to her pravastatin.  Blood pressure is well controlled.  No other medication changes today.  Follow-up annually or sooner as necessary.   Pixie Casino, MD, [facility], [place] Director of the Advanced Lipid Disorders &  Cardiovascular Risk Reduction Clinic Diplomate of the American Board of Clinical Lipidology Attending Cardiologist  Direct Dial: [phone]  Fax: [phone]   Website:  [email or web address] Osgood Kamaya Keckler 08/05/2020, 3:41 PM

## 2020-08-20 ENCOUNTER — Other Ambulatory Visit: Payer: Self-pay | Admitting: Obstetrics and Gynecology

## 2020-08-20 DIAGNOSIS — E2839 Other primary ovarian failure: Secondary | ICD-10-CM

## 2020-08-20 DIAGNOSIS — Z1231 Encounter for screening mammogram for malignant neoplasm of breast: Secondary | ICD-10-CM

## 2020-11-24 ENCOUNTER — Other Ambulatory Visit: Payer: Self-pay | Admitting: Internal Medicine

## 2020-11-24 DIAGNOSIS — I4891 Unspecified atrial fibrillation: Secondary | ICD-10-CM

## 2020-11-24 NOTE — Telephone Encounter (Signed)
Prescription refill request for Xarelto received.  Indication:afib Last office visit:hilty 08/05/20 Weight:61.7kg Age:40fScr:0.87 08/12/20 CrCl:55.3

## 2020-12-02 ENCOUNTER — Other Ambulatory Visit: Payer: Self-pay | Admitting: Internal Medicine

## 2020-12-02 DIAGNOSIS — I4891 Unspecified atrial fibrillation: Secondary | ICD-10-CM

## 2021-02-03 ENCOUNTER — Ambulatory Visit
Admission: RE | Admit: 2021-02-03 | Discharge: 2021-02-03 | Disposition: A | Discharge status: Home / Self Care | Payer: Medicare Other | Source: Ambulatory Visit | Attending: Obstetrics and Gynecology | Admitting: Obstetrics and Gynecology

## 2021-02-03 ENCOUNTER — Other Ambulatory Visit: Payer: Self-pay

## 2021-02-03 DIAGNOSIS — Z1231 Encounter for screening mammogram for malignant neoplasm of breast: Secondary | ICD-10-CM

## 2021-02-03 DIAGNOSIS — E2839 Other primary ovarian failure: Secondary | ICD-10-CM

## 2021-02-16 ENCOUNTER — Other Ambulatory Visit: Payer: Self-pay | Admitting: *Deleted

## 2021-02-23 NOTE — Progress Notes (Signed)
Patient Care Team: Chesley Noon, MD as PCP - General (Family Medicine) Debara Pickett Nadean Corwin, MD as PCP - Cardiology (Cardiology)  DIAGNOSIS:    ICD-10-CM   1. Malignant neoplasm of upper-outer quadrant of right female breast, unspecified estrogen receptor status ([place])  C50.411      CHIEF COMPLIANT: Low white blood cell count  INTERVAL HISTORY: Shelley Gutierrez is a 75 y.o. with above-mentioned history of right-sided breast cancer currently on surveillance. Mammogram on 02/03/2021 showed no evidence of malignancy. She presents to the clinic today for follow-up.  She came back to [place] because of concern regarding leukopenia.  She has watched her blood counts over the past couple of years and has noticed that the white blood cell count has slowly declined from 4-4.5 down to 3.  She has not had any recent infections or illnesses.  She has had COVID-19 vaccines and has not started any new medications in this interim. However upon further review it appears that even 14 years ago she had a white count of 3.1 with a normal differential.  Therefore it does not appear to be anything recent and she has not had any health issues or concerns with this low white count.  ALLERGIES:  is allergic to penicillins.  MEDICATIONS:  Current Outpatient Medications  Medication Sig Dispense Refill   CALCIUM PO Take by mouth daily.     fluticasone (FLONASE) 50 MCG/ACT nasal spray as needed.     losartan (COZAAR) 50 MG tablet Take 50 mg by mouth daily.  3   metoprolol succinate (TOPROL-XL) 25 MG 24 hr tablet TAKE 1/2 TABLET BY MOUTH EVERY DAY 45 tablet 2   Multiple Vitamin (MULTIVITAMIN WITH MINERALS) TABS Take 1 tablet by mouth daily.     pravastatin (PRAVACHOL) 40 MG tablet Take 40 mg by mouth daily.     vitamin C (ASCORBIC ACID) 500 MG tablet Take 500 mg by mouth daily.     XARELTO 20 MG TABS tablet TAKE 1 TABLET BY MOUTH EVERY DAY WITH SUPPER 90 tablet 1   No current facility-administered medications for this  visit.    PHYSICAL EXAMINATION: ECOG PERFORMANCE STATUS: 0 - Asymptomatic  Vitals:   02/24/21 1329  BP: 137/81  Pulse: 84  Resp: 18  Temp: (!) 97.2 F (36.2 C)  SpO2: 100%   Filed Weights   02/24/21 1329  Weight: 143 lb 11.2 oz (65.2 kg)      LABORATORY DATA:  I have reviewed the data as listed CMP Latest Ref Rng & Units 10/10/2016 10/05/2015 09/17/2014  Glucose 70 - 140 mg/dl 94 103 98  BUN 7.0 - 26.0 mg/dL 14.6 11.3 11.9  Creatinine 0.6 - 1.1 mg/dL 0.9 0.9 0.8  Sodium 136 - 145 mEq/L 140 140 142  Potassium 3.5 - 5.1 mEq/L 4.1 4.4 4.2  Chloride 98 - 107 mEq/L - - -  CO2 22 - 29 mEq/L 27 27 27   Calcium 8.4 - 10.4 mg/dL 10.6(H) 9.9 10.0  Total Protein 6.4 - 8.3 g/dL 7.2 7.2 7.1  Total Bilirubin 0.20 - 1.20 mg/dL 0.45 0.42 0.46  Alkaline Phos 40 - 150 U/L 69 63 70  AST 5 - 34 U/L 18 15 17   ALT 0 - 55 U/L 17 16 19     Lab Results  Component Value Date   WBC 3.6 (L) 10/23/2017   HGB 13.3 10/23/2017   HCT 39.7 10/23/2017   MCV 90.0 10/23/2017   PLT 212 10/23/2017   NEUTROABS 1.7 10/23/2017  ASSESSMENT & PLAN:  Breast cancer of upper-outer quadrant of right female breast ([place]) Right breast cancer diagnosed in 1996 status post right mastectomy with axillary lymph node dissection with immediate TRAM reconstruction (I do not have any proper records of pathological staging or receptor status)    Cyclical neutropenia: I discussed with the patient extensively that the low white count has been going on for at least 14 years if not longer.  The differential is normal and she has not had any frequent illnesses or infections.  Therefore no additional work-ups or treatments are necessary.  Return to clinic on an as-needed basis.  No orders of the defined types were placed in this encounter.  The patient has a good understanding of the overall plan. she agrees with it. she will call with any problems that may develop before the next visit here.  Total time spent: 45 mins  including face to face time and time spent for planning, charting and coordination of care  Rulon Eisenmenger, MD, MPH 02/24/2021  I, Thana Ates, am acting as scribe for Dr. Nicholas Lose.  I have reviewed the above documentation for accuracy and completeness, and I agree with the above.

## 2021-02-24 ENCOUNTER — Inpatient Hospital Stay: Payer: Medicare Other | Attending: Hematology and Oncology | Admitting: Hematology and Oncology

## 2021-02-24 ENCOUNTER — Other Ambulatory Visit: Payer: Self-pay

## 2021-02-24 DIAGNOSIS — Z853 Personal history of malignant neoplasm of breast: Secondary | ICD-10-CM | POA: Diagnosis not present

## 2021-02-24 DIAGNOSIS — C50411 Malignant neoplasm of upper-outer quadrant of right female breast: Secondary | ICD-10-CM

## 2021-02-24 DIAGNOSIS — Z9011 Acquired absence of right breast and nipple: Secondary | ICD-10-CM | POA: Insufficient documentation

## 2021-02-24 DIAGNOSIS — D704 Cyclic neutropenia: Secondary | ICD-10-CM | POA: Insufficient documentation

## 2021-02-24 MED ORDER — PRAVASTATIN SODIUM 80 MG PO TABS: 80.0000 mg | ORAL_TABLET | Freq: Every day | ORAL | Status: DC

## 2021-02-24 NOTE — Assessment & Plan Note (Signed)
Right breast cancer diagnosed in 1996 status post right mastectomy with axillary lymph node dissection with immediate TRAM reconstruction (I do not have any proper records of pathological staging or receptor status)  Breast Cancer Surveillance: 1. Breast exam 10/05/2015: Normal 2. Mammogram 06/11/2015 hour away with the right No abnormalities. Postsurgical changes. Breast Density Category B. I recommended that she get 3-D mammograms for surveillance. Discussed the differences between different breast density categories.  Cyclical neutropenia: Under observation. 1 year follow-up on this with labs, WBC count 3.1 with an [place] of 1500. No significant change from last 9 years. Atrial fibrillation: Currently on xarelto and Toprol-XL. Return to clinic in 1 year for follow-up

## 2021-02-28 ENCOUNTER — Ambulatory Visit: Payer: Medicare Other | Admitting: Hematology and Oncology

## 2021-05-08 ENCOUNTER — Other Ambulatory Visit: Payer: Self-pay | Admitting: Internal Medicine

## 2021-05-08 DIAGNOSIS — I4891 Unspecified atrial fibrillation: Secondary | ICD-10-CM

## 2021-05-09 ENCOUNTER — Other Ambulatory Visit: Payer: Self-pay

## 2021-05-09 DIAGNOSIS — I4891 Unspecified atrial fibrillation: Secondary | ICD-10-CM

## 2021-05-09 MED ORDER — RIVAROXABAN 20 MG PO TABS: ORAL_TABLET | ORAL | 1 refills | Status: DC

## 2021-05-09 NOTE — Telephone Encounter (Signed)
Prescription refill request for Xarelto received.  Indication:Afib Last office visit:4/22 Weight:65.2 kg Age:76 Scr:0.5 CrCl:101.6 ml/min  Prescription refilled

## 2021-08-28 ENCOUNTER — Other Ambulatory Visit: Payer: Self-pay | Admitting: Internal Medicine

## 2021-08-28 DIAGNOSIS — I4891 Unspecified atrial fibrillation: Secondary | ICD-10-CM

## 2021-10-23 ENCOUNTER — Other Ambulatory Visit: Payer: Self-pay | Admitting: Internal Medicine

## 2021-10-23 DIAGNOSIS — I4891 Unspecified atrial fibrillation: Secondary | ICD-10-CM

## 2021-11-28 ENCOUNTER — Other Ambulatory Visit: Payer: Self-pay | Admitting: Internal Medicine

## 2021-11-28 DIAGNOSIS — I4891 Unspecified atrial fibrillation: Secondary | ICD-10-CM

## 2021-11-28 NOTE — Telephone Encounter (Signed)
Prescription refill request for Xarelto received.  Indication: Atrial Fib Last office visit: 08/05/20  Alma Friendly MD Weight: 61.7kg Age: 76 Scr: 0.83 on 02/08/21 CrCl: 57.04  Based on above findings Xarelto '20mg'$  daily is the appropriate dose.  Refill approved.

## 2021-11-29 ENCOUNTER — Encounter: Payer: Self-pay | Admitting: Physician Assistant

## 2021-11-29 ENCOUNTER — Ambulatory Visit (INDEPENDENT_AMBULATORY_CARE_PROVIDER_SITE_OTHER): Payer: Medicare Other | Admitting: Physician Assistant

## 2021-11-29 VITALS — BP 134/72 | HR 66 | Ht 65.0 in | Wt 143.2 lb

## 2021-11-29 DIAGNOSIS — I480 Paroxysmal atrial fibrillation: Secondary | ICD-10-CM

## 2021-11-29 DIAGNOSIS — I10 Essential (primary) hypertension: Secondary | ICD-10-CM

## 2021-11-29 DIAGNOSIS — E785 Hyperlipidemia, unspecified: Secondary | ICD-10-CM | POA: Diagnosis not present

## 2021-11-29 NOTE — Progress Notes (Unsigned)
Cardiology Office Note:    Date:  12/01/2021   ID:  Shelley Gutierrez, DOB 1945-07-27, MRN [id number]  PCP:  Chesley Noon, MD   [facility] Cardiologist:  Pixie Casino, MD     Referring MD: Chesley Noon, MD   Chief Complaint  Patient presents with   Follow-up    Seen for Dr. Debara Pickett    History of Present Illness:    Shelley Gutierrez is a 76 y.o. female with a hx of history of right breast cancer s/p mastectomy, hyperlipidemia, hypertension, and PAF.  Patient was initially referred to cardiology service due to abnormal EKG with Q waves in V1 and V2.  Nuclear stress test in October 2012 was negative, she was able to achieve 7 METS of activity during exercise.  Heart monitor in February 2017 revealed episodes of atrial fibrillation and was started on Xarelto.  Patient was last seen by Dr. Debara Pickett in April 2021 at which time she was doing well.  She was seen by Dr. Leland Johns of oncology service for follow-up in October 2022.  She was seen by her PCP in March 2023 at which time she complained of joint pain related to pravastatin.  She tried to cut the dose in half and try to take at night, however the symptoms continued, therefore she stopped the medication.  She had intolerance with Lipitor and Crestor in the past as well.  Patient presents today for follow-up.  Her previous lipid panel in December while she was on pravastatin showed a total cholesterol 170, triglycerides 71, HDL 62, LDL 94.  Repeat cholesterol panel obtained in May 2023 after coming off of pravastatin showed total cholesterol 227, triglyceride 83, HDL 67 and LDL 146.  She says that she is doing a trial of diet and exercise for 6 months and plans to have a repeat blood work in October.  She is aware that her LDL goal should be at least less than 100.  If her cholesterol remains uncontrolled, we can either consider Zetia versus Livalo versus pempdoic acid versus inclisiran (lack of CAD history may be a problem  with shortness).  She likely would not qualify for PCSK9 inhibitor given lack of prior history of MI.  I recommended she follow-up in 6 months.  Past Medical History:  Diagnosis Date   Arthritis    Bursitis right shoulder   Atypical chest pain 02/21/2011   Nuc, EF-83, Normal stress test   Breast cancer ([place]) 03/03/2011   Right mastectomy- '96-surgery only for breast cancer   Cyclical neutropenia ([place]) 03/03/2011   Dyslipidemia    Family history of breast cancer    Family history of pancreatic cancer    Family history of throat cancer    GERD (gastroesophageal reflux disease)    Hypercholesteremia    Hypertension    Pulsatile tinnitus 03/08/2012   Carotid doppler, mild amount of fibrous plaque, no evidence of significant diameter reduction    Past Surgical History:  Procedure Laterality Date   BREAST EXCISIONAL BIOPSY Left    BREAST RECONSTRUCTION  1996   Right Side   BREAST RECONSTRUCTION  2010   Right Side   CATARACT EXTRACTION, BILATERAL Bilateral    COLONOSCOPY WITH PROPOFOL N/A 01/12/2015   Procedure: COLONOSCOPY WITH PROPOFOL;  Surgeon: Garlan Fair, MD;  Location: [facility];  Service: Endoscopy;  Laterality: N/A;   MASTECTOMY Right 1996    Current Medications: Current Meds  Medication Sig   CALCIUM PO Take  by mouth daily.   losartan (COZAAR) 50 MG tablet Take 50 mg by mouth daily.   metoprolol succinate (TOPROL-XL) 25 MG 24 hr tablet Take 0.5 tablets (12.5 mg total) by mouth daily.   Multiple Vitamin (MULTIVITAMIN WITH MINERALS) TABS Take 1 tablet by mouth daily.   vitamin C (ASCORBIC ACID) 500 MG tablet Take 500 mg by mouth daily.   XARELTO 20 MG TABS tablet TAKE 1 TABLET BY MOUTH EVERY DAY WITH SUPPER     Allergies:   Penicillins   Social History   Socioeconomic History   Marital status: Married    Spouse name: Not on file   Number of children: Not on file   Years of education: Not on file   Highest education level: Not on file  Occupational History    Not on file  Tobacco Use   Smoking status: Never   Smokeless tobacco: Never  Substance and Sexual Activity   Alcohol use: No   Drug use: No   Sexual activity: Not Currently  Other Topics Concern   Not on file  Social History Narrative   Epworth Sleepiness Scale         Score Total: 8         --I have high blood pressure      --I wake up to urinate frequently at night      --I have been told that I snore   Social Determinants of Health   Financial Resource Strain: Not on file  Food Insecurity: Not on file  Transportation Needs: Not on file  Physical Activity: Not on file  Stress: Not on file  Social Connections: Not on file     Family History: The patient's family history includes Aneurysm in her maternal uncle; Breast cancer in her mother; Breast cancer (age of onset: 19) in her maternal aunt; Cancer in her half-brother and half-sister; Cancer (age of onset: 67) in her maternal aunt; Diabetes in her maternal grandmother; Berenice Primas' disease in her half-sister; Heart Problems in her maternal grandfather; Pancreatic cancer (age of onset: 52) in her maternal grandmother; Pneumonia in her maternal uncle; Throat cancer (age of onset: 72) in her father.  ROS:   Please see the history of present illness.     All other systems reviewed and are negative.  EKGs/Labs/Other Studies Reviewed:    The following studies were reviewed today:  N/A  EKG:  EKG is ordered today.  The ekg ordered today demonstrates normal sinus rhythm, no significant ST-T wave changes.  Recent Labs: No results found for requested labs within last 365 days.  Recent Lipid Panel No results found for: "CHOL", "TRIG", "HDL", "CHOLHDL", "VLDL", "LDLCALC", "LDLDIRECT"   Risk Assessment/Calculations:           Physical Exam:    VS:  BP 134/72   Pulse 66   Ht '5\' 5"'$  (1.651 m)   Wt 143 lb 3.2 oz (65 kg)   SpO2 99%   BMI 23.83 kg/m        Wt Readings from Last 3 Encounters:  11/29/21 143 lb 3.2  oz (65 kg)  02/24/21 143 lb 11.2 oz (65.2 kg)  08/05/20 136 lb (61.7 kg)     GEN:  Well nourished, well developed in no acute distress HEENT: Normal NECK: No JVD; No carotid bruits LYMPHATICS: No lymphadenopathy CARDIAC: RRR, no murmurs, rubs, gallops RESPIRATORY:  Clear to auscultation without rales, wheezing or rhonchi  ABDOMEN: Soft, non-tender, non-distended MUSCULOSKELETAL:  No edema; No deformity  SKIN:  Warm and dry NEUROLOGIC:  Alert and oriented x 3 PSYCHIATRIC:  Normal affect   ASSESSMENT:    1. PAF (paroxysmal atrial fibrillation) ([place])   2. Essential hypertension   3. Hyperlipidemia LDL goal <100    PLAN:    In order of problems listed above:  PAF: On Xarelto and metoprolol  Hypertension: Blood pressure stable  Hyperlipidemia: She was unable to tolerate pravastatin.  She has tried multiple statins in the past.  At this time, she is hesitant to try another medication, she wished to do a trial of diet and exercise.  I recommended follow-up in 6 months, if LDL remains elevated, we may have to consider Livalo versus Zetia versus Bempdoic acid.  I am not sure if she qualifies for PCSK9 inhibitor or inclisiran given lack of history of CAD or MI.           Medication Adjustments/Labs and Tests Ordered: Current medicines are reviewed at length with the patient today.  Concerns regarding medicines are outlined above.  Orders Placed This Encounter  Procedures   EKG 12-Lead   No orders of the defined types were placed in this encounter.   Patient Instructions  Medication Instructions:  Your physician recommends that you continue on your current medications as directed. Please refer to the Current Medication list given to you today.  *If you need a refill on your cardiac medications before your next appointment, please call your pharmacy*   Lab Work: NONE If you have labs (blood work) drawn today and your tests are completely normal, you will receive your results  only by: [place] (if you have MyChart) OR A paper copy in the mail If you have any lab test that is abnormal or we need to change your treatment, we will call you to review the results.   Testing/Procedures: NONE   Follow-Up: At [facility], you and your health needs are our priority.  As part of our continuing mission to provide you with exceptional heart care, we have created designated Provider Care Teams.  These Care Teams include your primary Cardiologist (physician) and Advanced Practice Providers (APPs -  Physician Assistants and Nurse Practitioners) who all work together to provide you with the care you need, when you need it.  We recommend signing up for the patient portal called "MyChart".  Sign up information is provided on this After Visit Summary.  MyChart is used to connect with patients for Virtual Visits (Telemedicine).  Patients are able to view lab/test results, encounter notes, upcoming appointments, etc.  Non-urgent messages can be sent to your provider as well.   To learn more about what you can do with MyChart, go to [email or web address].    Your next appointment:   6 month(s)  The format for your next appointment:   In Person  Provider:   Pixie Casino, MD     Signed, Almyra Deforest, [place]  12/01/2021 2:48 PM    [place]

## 2021-11-29 NOTE — Patient Instructions (Signed)
Medication Instructions:  Your physician recommends that you continue on your current medications as directed. Please refer to the Current Medication list given to you today.  *If you need a refill on your cardiac medications before your next appointment, please call your pharmacy*   Lab Work: NONE If you have labs (blood work) drawn today and your tests are completely normal, you will receive your results only by: [place] (if you have MyChart) OR A paper copy in the mail If you have any lab test that is abnormal or we need to change your treatment, we will call you to review the results.   Testing/Procedures: NONE   Follow-Up: At [facility], you and your health needs are our priority.  As part of our continuing mission to provide you with exceptional heart care, we have created designated Provider Care Teams.  These Care Teams include your primary Cardiologist (physician) and Advanced Practice Providers (APPs -  Physician Assistants and Nurse Practitioners) who all work together to provide you with the care you need, when you need it.  We recommend signing up for the patient portal called "MyChart".  Sign up information is provided on this After Visit Summary.  MyChart is used to connect with patients for Virtual Visits (Telemedicine).  Patients are able to view lab/test results, encounter notes, upcoming appointments, etc.  Non-urgent messages can be sent to your provider as well.   To learn more about what you can do with MyChart, go to [email or web address].    Your next appointment:   6 month(s)  The format for your next appointment:   In Person  Provider:   Pixie Casino, MD

## 2021-12-01 ENCOUNTER — Encounter: Payer: Self-pay | Admitting: Physician Assistant

## 2021-12-17 ENCOUNTER — Other Ambulatory Visit: Payer: Self-pay | Admitting: Internal Medicine

## 2021-12-17 DIAGNOSIS — I4891 Unspecified atrial fibrillation: Secondary | ICD-10-CM

## 2021-12-27 ENCOUNTER — Other Ambulatory Visit: Payer: Self-pay | Admitting: Obstetrics and Gynecology

## 2021-12-27 DIAGNOSIS — Z1231 Encounter for screening mammogram for malignant neoplasm of breast: Secondary | ICD-10-CM

## 2022-02-08 ENCOUNTER — Ambulatory Visit
Admission: RE | Admit: 2022-02-08 | Discharge: 2022-02-08 | Disposition: A | Discharge status: Home / Self Care | Payer: Medicare Other | Source: Ambulatory Visit | Attending: Obstetrics and Gynecology | Admitting: Obstetrics and Gynecology

## 2022-02-08 DIAGNOSIS — Z1231 Encounter for screening mammogram for malignant neoplasm of breast: Secondary | ICD-10-CM

## 2022-06-06 ENCOUNTER — Other Ambulatory Visit: Payer: Self-pay | Admitting: Internal Medicine

## 2022-06-06 DIAGNOSIS — I4891 Unspecified atrial fibrillation: Secondary | ICD-10-CM

## 2022-06-06 NOTE — Telephone Encounter (Signed)
Prescription refill request for Xarelto received.  Indication: Afib  Last office visit: 11/29/21 Shelley Gutierrez)  Weight: 65kg Age: 77 Scr: 0.9 (04/18/22)  CrCl: 55.60m/min  Appropriate dose. Refill sent.

## 2022-09-19 ENCOUNTER — Encounter: Payer: Self-pay | Admitting: Physician Assistant

## 2022-09-19 ENCOUNTER — Ambulatory Visit: Payer: Medicare Other | Attending: Physician Assistant | Admitting: Physician Assistant

## 2022-09-19 VITALS — BP 136/78 | HR 67 | Ht 65.0 in | Wt 142.4 lb

## 2022-09-19 DIAGNOSIS — I10 Essential (primary) hypertension: Secondary | ICD-10-CM | POA: Insufficient documentation

## 2022-09-19 DIAGNOSIS — I480 Paroxysmal atrial fibrillation: Secondary | ICD-10-CM | POA: Diagnosis present

## 2022-09-19 DIAGNOSIS — E785 Hyperlipidemia, unspecified: Secondary | ICD-10-CM | POA: Diagnosis present

## 2022-09-19 NOTE — Patient Instructions (Signed)
Medication Instructions:  Your physician recommends that you continue on your current medications as directed. Please refer to the Current Medication list given to you today.   *If you need a refill on your cardiac medications before your next appointment, please call your pharmacy*   Lab Work: NONE ordered at this time of appointment   If you have labs (blood work) drawn today and your tests are completely normal, you will receive your results only by: MyChart Message (if you have MyChart) OR A paper copy in the mail If you have any lab test that is abnormal or we need to change your treatment, we will call you to review the results.   Testing/Procedures: NONE ordered at this time of appointment     Follow-Up: At [facility], you and your health needs are our priority.  As part of our continuing mission to provide you with exceptional heart care, we have created designated Provider Care Teams.  These Care Teams include your primary Cardiologist (physician) and Advanced Practice Providers (APPs -  Physician Assistants and Nurse Practitioners) who all work together to provide you with the care you need, when you need it.  We recommend signing up for the patient portal called "MyChart".  Sign up information is provided on this After Visit Summary.  MyChart is used to connect with patients for Virtual Visits (Telemedicine).  Patients are able to view lab/test results, encounter notes, upcoming appointments, etc.  Non-urgent messages can be sent to your provider as well.   To learn more about what you can do with MyChart, go to [email or web address].    Your next appointment:   1 year(s)  Provider:   Kenneth C Hilty, MD     Other Instructions  

## 2022-09-19 NOTE — Progress Notes (Unsigned)
Cardiology Office Note:    Date:  09/21/2022   ID:  Shelley Gutierrez, DOB 1945/05/06, MRN [id number]  PCP:  Shelley Inch, MD   Shelley Gutierrez Cardiologist:  Shelley Nose, MD     Referring MD: Shelley Inch, MD   Chief Complaint  Patient presents with   Follow-up    Seen for Dr. Rennis Gutierrez    History of Present Illness:    Shelley Gutierrez is a 77 y.o. female with a hx of right breast cancer s/p mastectomy, hyperlipidemia, hypertension, and PAF.  Patient was initially referred to cardiology service due to abnormal EKG with Q waves in V1 and V2.  Nuclear stress test in October 2012 was negative, she was able to achieve 7 METS of activity during exercise.  Heart monitor in February 2017 revealed episodes of atrial fibrillation and was started on Xarelto.  She was seen by Dr. Georgiann Gutierrez of oncology service for follow-up in October 2022.  She was seen by her PCP in March 2023 at which time she complained of joint pain related to pravastatin.  She tried to cut the dose in half and try to take at night, however the symptoms continued, therefore she stopped the medication.  She had intolerance with Lipitor and Crestor in the past as well.   Patient presents today for cardiology follow-up.  She denies any recent chest pain or shortness of breath.  She has not experienced any palpitation.  She was started on Zetia 6 months ago.  Lipid panel obtained late last year showed significant improvement of her LDL.  Her LDL is now around 120s instead of 160s.  Her LDL goal is less than 100.  She likely will not qualify for PCSK9 inhibitor without prior diagnosis of CAD.  Will continue on Zetia and work on diet and exercise.  Overall, she has been doing well and can follow-up in 1 year.    Past Medical History:  Diagnosis Date   Arthritis    Bursitis right shoulder   Atypical chest pain 02/21/2011   Nuc, EF-83, Normal stress test   Breast cancer ([facility]) 03/03/2011   Right mastectomy-  '96-surgery only for breast cancer   Cyclical neutropenia ([facility]) 03/03/2011   Dyslipidemia    Family history of breast cancer    Family history of pancreatic cancer    Family history of throat cancer    GERD (gastroesophageal reflux disease)    Hypercholesteremia    Hypertension    Pulsatile tinnitus 03/08/2012   Carotid doppler, mild amount of fibrous plaque, no evidence of significant diameter reduction    Past Surgical History:  Procedure Laterality Date   BREAST EXCISIONAL BIOPSY Left    BREAST RECONSTRUCTION  1996   Right Side   BREAST RECONSTRUCTION  2010   Right Side   CATARACT EXTRACTION, BILATERAL Bilateral    COLONOSCOPY WITH PROPOFOL N/A 01/12/2015   Procedure: COLONOSCOPY WITH PROPOFOL;  Surgeon: Shelley Bumpers, MD;  Location: [facility];  Service: Endoscopy;  Laterality: N/A;   MASTECTOMY Right 1996    Current Medications: Current Meds  Medication Sig   ezetimibe (ZETIA) 10 MG tablet Take 1 tablet by mouth daily.     Allergies:   Penicillins   Social History   Socioeconomic History   Marital status: Married    Spouse name: Not on file   Number of children: Not on file   Years of education: Not on file   Highest education level: Not on  file  Occupational History   Not on file  Tobacco Use   Smoking status: Never   Smokeless tobacco: Never  Substance and Sexual Activity   Alcohol use: No   Drug use: No   Sexual activity: Not Currently  Other Topics Concern   Not on file  Social History Narrative   Epworth Sleepiness Scale         Score Total: 8         --I have high blood pressure      --I wake up to urinate frequently at night      --I have been told that I snore   Social Determinants of Health   Financial Resource Strain: Not on file  Food Insecurity: Not on file  Transportation Needs: Not on file  Physical Activity: Not on file  Stress: Not on file  Social Connections: Not on file     Family History: The patient's family  history includes Aneurysm in her maternal uncle; Breast cancer in her mother; Breast cancer (age of onset: 35) in her maternal aunt; Cancer in her half-brother and half-sister; Cancer (age of onset: 42) in her maternal aunt; Diabetes in her maternal grandmother; Shelley Blare' disease in her half-sister; Heart Problems in her maternal grandfather; Pancreatic cancer (age of onset: 58) in her maternal grandmother; Pneumonia in her maternal uncle; Throat cancer (age of onset: 67) in her father.  ROS:   Please see the history of present illness.     All other systems reviewed and are negative.  EKGs/Labs/Other Studies Reviewed:    The following studies were reviewed today:  N/A  EKG:  EKG is ordered today.  The ekg ordered today demonstrates normal sinus rhythm, no significant ST-T wave changes.  Recent Labs: No results found for requested labs within last 365 days.  Recent Lipid Panel No results found for: "CHOL", "TRIG", "HDL", "CHOLHDL", "VLDL", "LDLCALC", "LDLDIRECT"   Risk Assessment/Calculations:    CHA2DS2-VASc Score = 4   This indicates a 4.8% annual risk of stroke. The patient's score is based upon: CHF History: 0 HTN History: 1 Diabetes History: 0 Stroke History: 0 Vascular Disease History: 0 Age Score: 2 Gender Score: 1          Physical Exam:    VS:  BP 136/78 (BP Location: Left Arm, Patient Position: Sitting, Cuff Size: Normal)   Pulse 67   Ht 5\' 5"  (1.651 m)   Wt 142 lb 6.4 oz (64.6 kg)   SpO2 97%   BMI 23.70 kg/m        Wt Readings from Last 3 Encounters:  09/19/22 142 lb 6.4 oz (64.6 kg)  11/29/21 143 lb 3.2 oz (65 kg)  02/24/21 143 lb 11.2 oz (65.2 kg)     GEN:  Well nourished, well developed in no acute distress HEENT: Normal NECK: No JVD; No carotid bruits LYMPHATICS: No lymphadenopathy CARDIAC: RRR, no murmurs, rubs, gallops RESPIRATORY:  Clear to auscultation without rales, wheezing or rhonchi  ABDOMEN: Soft, non-tender,  non-distended MUSCULOSKELETAL:  No edema; No deformity  SKIN: Warm and dry NEUROLOGIC:  Alert and oriented x 3 PSYCHIATRIC:  Normal affect   ASSESSMENT:    1. PAF (paroxysmal atrial fibrillation) ([facility])   2. Essential hypertension   3. Hyperlipidemia LDL goal <100    PLAN:    In order of problems listed above:  PAF: On metoprolol succinate and Xarelto.  Patient has not had any significant palpitation  Hypertension: Blood pressure stable  Hyperlipidemia: Intolerant of statins.  Patient was started on Zetia 6 months ago and had significant improvement in his LDL.           Medication Adjustments/Labs and Tests Ordered: Current medicines are reviewed at length with the patient today.  Concerns regarding medicines are outlined above.  Orders Placed This Encounter  Procedures   EKG 12-Lead   No orders of the defined types were placed in this encounter.   Patient Instructions  Medication Instructions:   Your physician recommends that you continue on your current medications as directed. Please refer to the Current Medication list given to you today.  *If you need a refill on your cardiac medications before your next appointment, please call your pharmacy*  Lab Work: NONE ordered at this time of appointment   If you have labs (blood work) drawn today and your tests are completely normal, you will receive your results only by: MyChart Message (if you have MyChart) OR A paper copy in the mail If you have any lab test that is abnormal or we need to change your treatment, we will call you to review the results.  Testing/Procedures: NONE ordered at this time of appointment   Follow-Up: At [facility], you and your health needs are our priority.  As part of our continuing mission to provide you with exceptional heart care, we have created designated Provider Care Teams.  These Care Teams include your primary Cardiologist (physician) and Advanced Practice Gutierrez  (APPs -  Physician Assistants and Nurse Practitioners) who all work together to provide you with the care you need, when you need it.  We recommend signing up for the patient portal called "MyChart".  Sign up information is provided on this After Visit Summary.  MyChart is used to connect with patients for Virtual Visits (Telemedicine).  Patients are able to view lab/test results, encounter notes, upcoming appointments, etc.  Non-urgent messages can be sent to your provider as well.   To learn more about what you can do with MyChart, go to [email or web address].    Your next appointment:   1 year(s)  Provider:   Chrystie Nose, MD     Other Instructions     Signed, Azalee Course, PA  09/21/2022 9:09 PM    [facility]

## 2022-09-21 ENCOUNTER — Encounter: Payer: Self-pay | Admitting: Physician Assistant

## 2022-10-07 ENCOUNTER — Other Ambulatory Visit: Payer: Self-pay | Admitting: Internal Medicine

## 2022-10-07 DIAGNOSIS — I4891 Unspecified atrial fibrillation: Secondary | ICD-10-CM

## 2022-10-09 NOTE — Telephone Encounter (Signed)
Prescription refill request for Xarelto received.  Indication:afib Last office visit:5/24 Weight:64.6  kg Age:77 Scr:0.83 5/24 CrCl:59.72  ml/min  Prescription refilled

## 2022-10-13 ENCOUNTER — Other Ambulatory Visit: Payer: Self-pay | Admitting: Obstetrics and Gynecology

## 2022-10-13 DIAGNOSIS — Z1231 Encounter for screening mammogram for malignant neoplasm of breast: Secondary | ICD-10-CM

## 2022-12-19 ENCOUNTER — Other Ambulatory Visit: Payer: Self-pay | Admitting: Internal Medicine

## 2022-12-19 DIAGNOSIS — I4891 Unspecified atrial fibrillation: Secondary | ICD-10-CM

## 2023-02-13 ENCOUNTER — Ambulatory Visit
Admission: RE | Admit: 2023-02-13 | Discharge: 2023-02-13 | Disposition: A | Discharge status: Home / Self Care | Payer: Medicare Other | Source: Ambulatory Visit | Attending: Obstetrics and Gynecology | Admitting: Obstetrics and Gynecology

## 2023-02-13 DIAGNOSIS — Z1231 Encounter for screening mammogram for malignant neoplasm of breast: Secondary | ICD-10-CM

## 2023-04-04 ENCOUNTER — Other Ambulatory Visit: Payer: Self-pay | Admitting: Internal Medicine

## 2023-04-04 DIAGNOSIS — I4891 Unspecified atrial fibrillation: Secondary | ICD-10-CM

## 2023-04-04 NOTE — Telephone Encounter (Signed)
Prescription refill request for Xarelto received.  Indication: PAF Last office visit: 09/19/22  Harrell Lark PA Weight: 64.6kg Age: 77 Scr: 0.91 on 03/02/23  [facility] CrCl: 53.64  Based on above findings Xarelto 20mg  daily is the appropriate dose.  Refill approved.

## 2023-09-24 ENCOUNTER — Other Ambulatory Visit: Payer: Self-pay | Admitting: Internal Medicine

## 2023-09-24 DIAGNOSIS — I4891 Unspecified atrial fibrillation: Secondary | ICD-10-CM

## 2023-09-25 NOTE — Telephone Encounter (Signed)
 Pt last saw Ervin Heath, [place] on 09/19/22, pt is overdue for follow-up.  Pt is overdue for 1 year follow-up with Dr Maximo Spar. Msg sent to schedulers to contact pt for follow-up appt.

## 2023-09-26 NOTE — Telephone Encounter (Signed)
 Pt scheduled follow-up appt with Dr Maximo Spar for 01/09/24.  Last labs 03/02/23 Creat 0.91 at [facility], age 78, weight 64.6kg, CrCl 53.64, based on CrCl pt is on appropriate dosage of Xarelto  20mg  every day for afib, will refill rx to get pt to upcoming appt.

## 2024-01-09 ENCOUNTER — Ambulatory Visit: Admitting: Internal Medicine

## 2024-01-11 ENCOUNTER — Other Ambulatory Visit: Payer: Self-pay | Admitting: Obstetrics and Gynecology

## 2024-01-11 DIAGNOSIS — Z1231 Encounter for screening mammogram for malignant neoplasm of breast: Secondary | ICD-10-CM

## 2024-01-14 ENCOUNTER — Encounter: Payer: Self-pay | Admitting: Internal Medicine

## 2024-01-14 ENCOUNTER — Ambulatory Visit: Attending: Internal Medicine | Admitting: Internal Medicine

## 2024-01-14 VITALS — BP 143/79 | HR 83 | Ht 65.0 in | Wt 141.8 lb

## 2024-01-14 DIAGNOSIS — Z7901 Long term (current) use of anticoagulants: Secondary | ICD-10-CM | POA: Insufficient documentation

## 2024-01-14 DIAGNOSIS — I10 Essential (primary) hypertension: Secondary | ICD-10-CM | POA: Diagnosis not present

## 2024-01-14 DIAGNOSIS — E785 Hyperlipidemia, unspecified: Secondary | ICD-10-CM | POA: Diagnosis present

## 2024-01-14 DIAGNOSIS — I480 Paroxysmal atrial fibrillation: Secondary | ICD-10-CM | POA: Insufficient documentation

## 2024-01-14 NOTE — Patient Instructions (Signed)
 Medication Instructions:  NO CHANGES  *If you need a refill on your cardiac medications before your next appointment, please call your pharmacy*   Follow-Up: At [facility], you and your health needs are our priority.  As part of our continuing mission to provide you with exceptional heart care, our providers are all part of one team.  This team includes your primary Cardiologist (physician) and Advanced Practice Providers or APPs (Physician Assistants and Nurse Practitioners) who all work together to provide you with the care you need, when you need it.  Your next appointment:    12 months with Dr. Maximo Spar  We recommend signing up for the patient portal called "MyChart".  Sign up information is provided on this After Visit Summary.  MyChart is used to connect with patients for Virtual Visits (Telemedicine).  Patients are able to view lab/test results, encounter notes, upcoming appointments, etc.  Non-urgent messages can be sent to your provider as well.   To learn more about what you can do with MyChart, go to [email or web address].

## 2024-01-14 NOTE — Progress Notes (Signed)
 OFFICE NOTE  Chief Complaint:  No complaints  Primary Care Physician: Sophronia Ozell BROCKS, MD  HPI:  Shelley Gutierrez is a pleasant 78 year old female that I first saw in 2012, but has not followed up with me in over 4 years. She was referred to me but Madelin Canner for evaluation of an abnormal EKG. At that time she had Q waves in V1 and V2 that was read as septal infarct. She was having some chest discomfort and was sent for evaluation of coronary disease. Her past medical history significant for hypertension and dyslipidemia as well as breast cancer status post right mastectomy. She underwent a nuclear stress test at that time which was negative for ischemia. She did exercise on the treadmill and achieved 7 METS of exercise and the study was interpreted as low risk. Since then she's had no further chest pain, however recently she's had the development of some palpitations. She has been under some stress and recently had some cold and URI symptoms for which she's been taking over-the-counter NyQuil with a decongestant. She also drinks some caffeine on a daily basis. She reports her palpitations are mostly worse at night and generally resolved by the morning. She denies any chest pain or shortness of breath. EKG today shows sinus rhythm.  [name] returns today for follow-up of her palpitations. She underwent monitoring but had some difficulty with the monitor. This was sent back to the company and she was re-fitted with a monitor. I did review her monitor results which showed episodes of paroxysmal atrial fibrillation which were fairly short-lived. Is not clear that she was symptomatic with these episodes. This patients CHA2DS2-VASc Score and unadjusted Ischemic Stroke Rate (% per year) is equal to 3.2 % stroke rate/year from a score of 3 (Above score calculated as 1 point each if present [CHF, HTN, DM, Vascular=MI/PAD/Aortic Plaque, Age if 65-74, or Female] Above score calculated as 2 points each if  present [Age > 75, or Stroke/TIA/TE]. I discussed the findings with her today and she had numerous questions for me. We spent greater than 30 minutes discussing the risks and benefits of anticoagulation, how anticoagulants work, why I believe she needs to be on something for rate control because of the possibility of recurrent A. fib and the fact that it may be associated with symptoms or significant tachycardia, etc. In addition, I asked Kristin Alvstad, PharmD, our anticoagulation pharmacist to talk with her about the risks and benefits of long-term anticoagulation. I propose today that she goes on to Xarelto  20 mg daily and discontinue aspirin. In addition I would recommend adding low-dose Toprol -XL 12.5 mg daily.  11/18/2015  [name] returns today for follow-up. She reports she's had no further palpitations. She seems to be tolerating Xarelto  without any difficulty. In general her energy level is good. There is no adverse bleeding.  07/18/2016  [name] was seen today in follow-up. She denies any palpitations or recurrent A. fib. She's not had any difficulty with Xarelto . Blood pressure at home is between 120 and 130 systolic although was elevated slightly today.  01/25/2017  [name] returns today for follow-up. Overall she's done well. She denies any recurrent A. fib. EKG shows sinus rhythm today. She is maintained on Xarelto  without any bleeding problems. Blood pressures been well controlled. She's also on pravastatin  which is followed by her primary care provider.  02/18/2018  [name] returns today for follow-up.  She is done really well.  Her blood pressure is good  today.  She denies any A. fib.  She has been on Xarelto  without any bleeding problems.  Cholesterol is followed by her PCP.   02/18/2020  [name] is seen today in follow-up.  She denies any recurrent atrial fibrillation.  She continues on Xarelto  without any bleeding issues.  More recently she is noted a small  drop in her white blood cell count 3.3, just below the 3.4 lower and cut off.  She may get referred to hematology for further evaluation of this.  Also her cholesterols been higher.  She reports being compliant with pravastatin .  She says she does not think she has had any significant dietary changes or weight gain which is appropriate.  Her total cholesterol is now 222, triglycerides 81, HDL 68 and LDL 140.  EKG today shows normal sinus rhythm.  Overall she says she feels like she has been off somewhat more recently after her second COVID-19 vaccine.   08/05/2020  [name] is seen today in follow-up.  Overall she is feeling well and is quite cheerful today.  Her recent labs do show however persistently elevated cholesterol with total 215, triglycerides 84, HDL 65 and LDL 135.  This was discussed with Dr. Sophronia who she also saw earlier this morning and she is working on dietary changes.  Weight is appropriate and blood pressure is well controlled.  EKG shows normal sinus rhythm.  She denies any significant bleeding issues on Xarelto  although gets some occasional nosebleeds.  She denies any recurrent atrial fibrillation.  01/14/2024  [name] is seen today in follow-up.  She seems to be doing well.  Denies any chest pain or shortness of breath.  Cholesterol is still higher than ideal with recent total 203, triglycerides 74, HDL 65 and LDL 125.  She is on ezetimibe 10 mg daily.  She could not tolerate statins.  She denies any recurrent atrial fibrillation.  She remains on Xarelto  without any bleeding issues.  Blood pressure was little elevated today 143/79 however correlated fairly well with her blood pressure cuff.  Home readings however demonstrate blood pressures in the 110-130 range systolic.  PMHx:  Past Medical History:  Diagnosis Date   Arthritis    Bursitis right shoulder   Atypical chest pain 02/21/2011   Nuc, EF-83, Normal stress test   Breast cancer (HCC) 03/03/2011   Right mastectomy-  '96-surgery only for breast cancer   Cyclical neutropenia (HCC) 03/03/2011   Dyslipidemia    Family history of breast cancer    Family history of pancreatic cancer    Family history of throat cancer    GERD (gastroesophageal reflux disease)    Hypercholesteremia    Hypertension    Pulsatile tinnitus 03/08/2012   Carotid doppler, mild amount of fibrous plaque, no evidence of significant diameter reduction    Past Surgical History:  Procedure Laterality Date   BREAST EXCISIONAL BIOPSY Left    BREAST RECONSTRUCTION  1996   Right Side   BREAST RECONSTRUCTION  2010   Right Side   CATARACT EXTRACTION, BILATERAL Bilateral    COLONOSCOPY WITH PROPOFOL  N/A 01/12/2015   Procedure: COLONOSCOPY WITH PROPOFOL ;  Surgeon: Gladis MARLA Louder, MD;  Location: [facility];  Service: Endoscopy;  Laterality: N/A;   MASTECTOMY Right 1996    FAMHx:  Family History  Problem Relation Age of Onset   Breast cancer Mother        dx early 59s   Throat cancer Father 81       heavy smoker  Pancreatic cancer Maternal Grandmother 93   Diabetes Maternal Grandmother    Heart Problems Maternal Grandfather        Poor Circulation   Cancer Half-Brother        unknown type, heavy smoker   Cancer Half-Sister        unknown type, heavy smoker   Graves' disease Half-Sister    Breast cancer Maternal Aunt 85   Aneurysm Maternal Uncle    Cancer Maternal Aunt 83       unknown type, smoker   Pneumonia Maternal Uncle     SOCHx:   reports that she has never smoked. She has never used smokeless tobacco. She reports that she does not drink alcohol and does not use drugs.  ALLERGIES:  Allergies  Allergen Reactions   Penicillins Other (See Comments)    Pt states that she does not like how Penicillin make her smell and only to be given in emergencies.    ROS: Pertinent items noted in HPI and remainder of comprehensive ROS otherwise negative.  HOME MEDS: Current Outpatient Medications  Medication Sig Dispense  Refill   CALCIUM PO Take by mouth daily.     ezetimibe (ZETIA) 10 MG tablet Take 1 tablet by mouth daily.     losartan (COZAAR) 50 MG tablet Take 50 mg by mouth daily.  3   metoprolol  succinate (TOPROL -XL) 25 MG 24 hr tablet TAKE 1/2 TABLET BY MOUTH DAILY 45 tablet 1   Multiple Vitamin (MULTIVITAMIN WITH MINERALS) TABS Take 1 tablet by mouth daily.     vitamin C (ASCORBIC ACID) 500 MG tablet Take 500 mg by mouth daily.     XARELTO  20 MG TABS tablet TAKE 1 TABLET BY MOUTH EVERY DAY WITH SUPPER 90 tablet 1   No current facility-administered medications for this visit.    LABS/IMAGING: No results found for this or any previous visit (from the past 48 hours). No results found.  WEIGHTS: Wt Readings from Last 3 Encounters:  01/14/24 141 lb 12.8 oz (64.3 kg)  09/19/22 142 lb 6.4 oz (64.6 kg)  11/29/21 143 lb 3.2 oz (65 kg)    VITALS: BP (!) 143/79 (BP Location: Left Arm, Patient Position: Sitting)   Pulse 83   Ht 5' 5 (1.651 m)   Wt 141 lb 12.8 oz (64.3 kg)   SpO2 97%   BMI 23.60 kg/m   EXAM: General appearance: alert and no distress Neck: no carotid bruit and no JVD Lungs: clear to auscultation bilaterally Heart: regular rate and rhythm, S1, S2 normal, no murmur, click, rub or gallop Abdomen: soft, non-tender; bowel sounds normal; no masses,  no organomegaly Extremities: extremities normal, atraumatic, no cyanosis or edema Pulses: 2+ and symmetric Skin: Skin color, texture, turgor normal. No rashes or lesions Neurologic: Grossly normal  EKG: EKG Interpretation Date/Time:  Monday January 14 2024 10:15:54 EDT Ventricular Rate:  83 PR Interval:  146 QRS Duration:  74 QT Interval:  360 QTC Calculation: 423 R Axis:   15  Text Interpretation: Normal sinus rhythm with sinus arrhythmia Low voltage QRS Possible Septal infarct , age undetermined No significant change since last tracing Confirmed by Mona Kent [phone]) on 01/14/2024 10:26:41 AM   ASSESSMENT: Palpitations  - PAF noted on the monitor, asymptomatic (CHADSVASC score 3) - on Xarelto  Hypertension Dyslipidemia goal LDL less than 70 History of breast cancer status post right mastectomy Statin intolerant-myalgias  PLAN: 1.   Mrs. Dike is without complaints.  Her blood pressure at home seems to be  better controlled and her blood pressure cuff today does correlate with our cuff.  I have encouraged her to use the arm cuff rather than a wrist cuff.  Her cholesterol is still higher than goal.  She is only on the ezetimibe.  I quickly discussed possibly adding Nexletol to her regimen since she cannot take statins however she politely declined.  No changes therefore were made to her medications today.  Follow-up annually or sooner as necessary.   Vinie KYM Maxcy, MD, [facility], FNLA, FACP  [place]  [facility]  Medical Director of the [facility]  [facility] Diplomate of the American Board of Clinical Lipidology Attending Cardiologist  Direct Dial: [phone]  Fax: [phone]  Website:  [email or web address] Vinie BROCKS Mechel Haggard 01/14/2024, 10:26 AM

## 2024-02-20 ENCOUNTER — Ambulatory Visit
Admission: RE | Admit: 2024-02-20 | Discharge: 2024-02-20 | Disposition: A | Source: Ambulatory Visit | Attending: Obstetrics and Gynecology | Admitting: Obstetrics and Gynecology

## 2024-02-20 DIAGNOSIS — Z1231 Encounter for screening mammogram for malignant neoplasm of breast: Secondary | ICD-10-CM

## 2024-03-24 ENCOUNTER — Other Ambulatory Visit: Payer: Self-pay | Admitting: Internal Medicine

## 2024-03-24 DIAGNOSIS — I4891 Unspecified atrial fibrillation: Secondary | ICD-10-CM

## 2024-04-01 ENCOUNTER — Other Ambulatory Visit: Payer: Self-pay | Admitting: Internal Medicine

## 2024-04-01 DIAGNOSIS — I4891 Unspecified atrial fibrillation: Secondary | ICD-10-CM

## 2024-04-01 NOTE — Telephone Encounter (Signed)
 Prescription refill request for Xarelto  received.  Indication:afib Last office visit:9/25 Weight:64.3  kg Age:78 Scr:0.82  2025 CrCl:58.32  ml/min  Prescription refilled
